# Patient Record
Sex: Male | Born: 1975 | Race: White | Hispanic: No | Marital: Married | Smoking: Former smoker
Health system: Southern US, Community
[De-identification: ages and names within clinical notes are randomized; demographics above are authoritative.]

## PROBLEM LIST (undated history)

## (undated) DIAGNOSIS — J329 Chronic sinusitis, unspecified: Secondary | ICD-10-CM

## (undated) DIAGNOSIS — IMO0001 Reserved for inherently not codable concepts without codable children: Secondary | ICD-10-CM

## (undated) DIAGNOSIS — E785 Hyperlipidemia, unspecified: Secondary | ICD-10-CM

## (undated) DIAGNOSIS — G43909 Migraine, unspecified, not intractable, without status migrainosus: Secondary | ICD-10-CM

## (undated) DIAGNOSIS — F329 Major depressive disorder, single episode, unspecified: Secondary | ICD-10-CM

## (undated) DIAGNOSIS — I10 Essential (primary) hypertension: Secondary | ICD-10-CM

## (undated) DIAGNOSIS — F32A Depression, unspecified: Secondary | ICD-10-CM

## (undated) DIAGNOSIS — F319 Bipolar disorder, unspecified: Secondary | ICD-10-CM

## (undated) HISTORY — PX: OTHER SURGICAL HISTORY: SHX169

---

## 2008-11-02 ENCOUNTER — Ambulatory Visit: Payer: Self-pay | Admitting: Family Medicine

## 2008-11-02 DIAGNOSIS — IMO0002 Reserved for concepts with insufficient information to code with codable children: Secondary | ICD-10-CM | POA: Insufficient documentation

## 2008-11-03 ENCOUNTER — Encounter: Payer: Self-pay | Admitting: Family Medicine

## 2012-04-21 ENCOUNTER — Emergency Department
Admission: EM | Admit: 2012-04-21 | Discharge: 2012-04-21 | Disposition: A | Discharge status: Home / Self Care | Payer: Self-pay | Source: Home / Self Care | Attending: Family Medicine | Admitting: Family Medicine

## 2012-04-21 ENCOUNTER — Encounter: Payer: Self-pay | Admitting: Emergency Medicine

## 2012-04-21 ENCOUNTER — Emergency Department (INDEPENDENT_AMBULATORY_CARE_PROVIDER_SITE_OTHER): Payer: 59

## 2012-04-21 DIAGNOSIS — R209 Unspecified disturbances of skin sensation: Secondary | ICD-10-CM

## 2012-04-21 DIAGNOSIS — S4352XA Sprain of left acromioclavicular joint, initial encounter: Secondary | ICD-10-CM

## 2012-04-21 DIAGNOSIS — S2341XA Sprain of ribs, initial encounter: Secondary | ICD-10-CM

## 2012-04-21 DIAGNOSIS — S29011A Strain of muscle and tendon of front wall of thorax, initial encounter: Secondary | ICD-10-CM

## 2012-04-21 DIAGNOSIS — W19XXXA Unspecified fall, initial encounter: Secondary | ICD-10-CM

## 2012-04-21 DIAGNOSIS — M25519 Pain in unspecified shoulder: Secondary | ICD-10-CM

## 2012-04-21 DIAGNOSIS — S4350XA Sprain of unspecified acromioclavicular joint, initial encounter: Secondary | ICD-10-CM

## 2012-04-21 MED ORDER — NAPROXEN 500 MG PO TABS: 500.0000 mg | ORAL_TABLET | Freq: Two times a day (BID) | ORAL | Status: DC

## 2012-04-21 MED ORDER — OXYCODONE-ACETAMINOPHEN 5-325 MG PO TABS: ORAL_TABLET | ORAL | Status: DC

## 2012-04-21 NOTE — ED Provider Notes (Signed)
History     CSN: [id number]  Arrival date & time 04/21/12  1108   First MD Initiated Contact with Patient 04/21/12 1239      Chief Complaint  Patient presents with  . Shoulder Injury     HPI Comments: While running yesterday, patient tripped and fell on his left shoulder and chest.  He has had persistent pain in his left shoulder and left anterior ribs.  No shortness of breath   Patient is a 37 y.o. male presenting with shoulder pain. The history is provided by the patient.  Shoulder Pain This is a new problem. The current episode started yesterday. The problem occurs constantly. Associated symptoms include chest pain. Pertinent negatives include no abdominal pain and no shortness of breath. Exacerbated by: raising left arm. Nothing relieves the symptoms. Treatments tried: Vicodin. The treatment provided mild relief.    History reviewed. No pertinent past medical history.  Past Surgical History  Procedure Date  . Right lower leg tendons     Family History  Problem Relation Age of Onset  . Cancer Mother   . Cancer Father     History  Substance Use Topics  . Smoking status: Current Every Day Smoker -- 0.5 packs/day for 25 years  . Smokeless tobacco: Not on file  . Alcohol Use: No      Review of Systems  Respiratory: Negative for shortness of breath.   Cardiovascular: Positive for chest pain.  Gastrointestinal: Negative for abdominal pain.  All other systems reviewed and are negative.    Allergies  Review of patient's allergies indicates no known allergies.  Home Medications   Current Outpatient Rx  Name  Route  Sig  Dispense  Refill  . NAPROXEN 500 MG PO TABS   Oral   Take 1 tablet (500 mg total) by mouth 2 (two) times daily. (every 12 hours with food)   20 tablet   1   . OXYCODONE-ACETAMINOPHEN 5-325 MG PO TABS      Take one tab by mouth HS PRN pain   7 tablet   0     BP 143/99  Pulse 107  Temp 98 F (36.7 C) (Oral)  Resp 16  Ht 5' 7.5"  (1.715 m)  Wt 207 lb (93.895 kg)  BMI 31.94 kg/m2  SpO2 99%  Physical Exam  Nursing note and vitals reviewed. Constitutional: He is oriented to person, place, and time. He appears well-developed and well-nourished. No distress.  HENT:  Head: Atraumatic.  Nose: Nose normal.  Mouth/Throat: Oropharynx is clear and moist.  Eyes: Conjunctivae normal and EOM are normal. Pupils are equal, round, and reactive to light.  Neck: Normal range of motion.  Cardiovascular: Normal heart sounds.   Pulmonary/Chest: Breath sounds normal.         There is rib tenderness under left pectoralis as noted on diagram.  Abdominal: There is no tenderness.  Musculoskeletal:       Left shoulder: He exhibits decreased range of motion, tenderness and bony tenderness. He exhibits no swelling, no effusion, no crepitus, no deformity, no laceration, normal pulse and normal strength.       Arms:      There is tenderness over left AC joint and left distal clavicle as noted on diagram.  Patient has difficulty abducting left arm above horizontal.   Neurological: He is alert and oriented to person, place, and time.  Skin: Skin is warm and dry. He is not diaphoretic.    ED Course  Procedures none  Dg Ribs Unilateral W/chest Left  04/21/2012  *RADIOLOGY REPORT*  Clinical Data: Fall, rib tenderness.  LEFT RIBS AND CHEST - 3+ VIEW  Comparison: None.  Findings: Heart and mediastinal contours are within normal limits. No focal opacities or effusions.  No acute bony abnormality.  No visible rib fracture.  No pneumothorax.  IMPRESSION: No active cardiopulmonary disease.   Original Report Authenticated By: Charlett Nose, M.D.    Dg Clavicle Left  04/21/2012  *RADIOLOGY REPORT*  Clinical Data: Fall, left clavicle tenderness.  LEFT CLAVICLE - 2+ VIEWS  Comparison: None.  Findings: No acute bony abnormality.  Specifically, no fracture, subluxation, or dislocation.  Soft tissues are intact.  Joint spaces are maintained.  IMPRESSION:  No bony abnormality.   Original Report Authenticated By: Charlett Nose, M.D.    Dg Ac Joints  04/21/2012  *RADIOLOGY REPORT*  Clinical Data: Fall, left shoulder pain.  LEFT ACROMIOCLAVICULAR JOINTS  Comparison: None.  Findings: Left AC joint is normal both without and with weights. No fracture.  No subluxation or dislocation.  The distal right clavicle is irregular.  There is widening of the right AC joint. This may be related to prior resection of the distal right clavicle.  No change with weightbearing.  IMPRESSION: No acute bony abnormality or evidence of AC joint separation on the left.  Mild widening of the right AC joint with irregularity in the distal right clavicle, question prior distal clavicular resection.   Original Report Authenticated By: Charlett Nose, M.D.      1. Sprain of left acromioclavicular joint   2. Intercostal muscle strain       MDM  Dispensed sling.  Begin Naproxen.  Rx for Percocet at bedtime prn Wear sling for 5 to 7 days.  Apply ice pack 3 or 4 times daily.  Begin range of motion exercises in about 5 days.   Followup with Dr. Rodney Langton if not improved in two weeks.        Lattie Haw, MD 04/25/12 2033

## 2012-04-21 NOTE — ED Notes (Signed)
Left shoulder injury, front and back upper ribs from fall yesterday

## 2014-07-10 ENCOUNTER — Emergency Department (HOSPITAL_COMMUNITY): Payer: BLUE CROSS/BLUE SHIELD

## 2014-07-10 ENCOUNTER — Emergency Department (HOSPITAL_COMMUNITY)
Admission: EM | Admit: 2014-07-10 | Discharge: 2014-07-11 | Disposition: A | Discharge status: Home / Self Care | Payer: BLUE CROSS/BLUE SHIELD | Attending: Emergency Medicine | Admitting: Emergency Medicine

## 2014-07-10 ENCOUNTER — Encounter (HOSPITAL_COMMUNITY): Payer: Self-pay | Admitting: *Deleted

## 2014-07-10 DIAGNOSIS — Z87891 Personal history of nicotine dependence: Secondary | ICD-10-CM | POA: Insufficient documentation

## 2014-07-10 DIAGNOSIS — Z79891 Long term (current) use of opiate analgesic: Secondary | ICD-10-CM | POA: Diagnosis not present

## 2014-07-10 DIAGNOSIS — R079 Chest pain, unspecified: Secondary | ICD-10-CM | POA: Diagnosis not present

## 2014-07-10 DIAGNOSIS — R61 Generalized hyperhidrosis: Secondary | ICD-10-CM | POA: Diagnosis not present

## 2014-07-10 LAB — TROPONIN I

## 2014-07-10 LAB — CBC WITH DIFFERENTIAL/PLATELET
Basophils Absolute: 0 10*3/uL (ref 0.0–0.1)
Basophils Relative: 0 % (ref 0–1)
EOS PCT: 5 % (ref 0–5)
Eosinophils Absolute: 0.3 10*3/uL (ref 0.0–0.7)
HCT: 39.8 % (ref 39.0–52.0)
Hemoglobin: 13.5 g/dL (ref 13.0–17.0)
LYMPHS ABS: 2.2 10*3/uL (ref 0.7–4.0)
Lymphocytes Relative: 43 % (ref 12–46)
MCH: 30.7 pg (ref 26.0–34.0)
MCHC: 33.9 g/dL (ref 30.0–36.0)
MCV: 90.5 fL (ref 78.0–100.0)
MONO ABS: 0.7 10*3/uL (ref 0.1–1.0)
Monocytes Relative: 14 % — ABNORMAL HIGH (ref 3–12)
Neutro Abs: 1.9 10*3/uL (ref 1.7–7.7)
Neutrophils Relative %: 38 % — ABNORMAL LOW (ref 43–77)
Platelets: 187 10*3/uL (ref 150–400)
RBC: 4.4 MIL/uL (ref 4.22–5.81)
RDW: 12.6 % (ref 11.5–15.5)
WBC: 5 10*3/uL (ref 4.0–10.5)

## 2014-07-10 LAB — COMPREHENSIVE METABOLIC PANEL
ALBUMIN: 4 g/dL (ref 3.5–5.2)
ALT: 27 U/L (ref 0–53)
ANION GAP: 9 (ref 5–15)
AST: 27 U/L (ref 0–37)
Alkaline Phosphatase: 71 U/L (ref 39–117)
BILIRUBIN TOTAL: 0.4 mg/dL (ref 0.3–1.2)
BUN: 15 mg/dL (ref 6–23)
CALCIUM: 8.8 mg/dL (ref 8.4–10.5)
CHLORIDE: 105 mmol/L (ref 96–112)
CO2: 26 mmol/L (ref 19–32)
Creatinine, Ser: 0.99 mg/dL (ref 0.50–1.35)
GFR calc Af Amer: >90 mL/min (ref >90)
GFR calc non Af Amer: >90 mL/min (ref >90)
GLUCOSE: 104 mg/dL — AB (ref 70–99)
POTASSIUM: 3.8 mmol/L (ref 3.5–5.1)
SODIUM: 140 mmol/L (ref 135–145)
TOTAL PROTEIN: 6.8 g/dL (ref 6.0–8.3)

## 2014-07-10 NOTE — ED Notes (Signed)
Pt reports chest pain while driving. Pain described as sharp, squeezing. EMS gave 324 ASA.

## 2014-07-10 NOTE — ED Provider Notes (Signed)
CSN: [id number]     Arrival date & time 07/10/14  2138 History   First MD Initiated Contact with Patient 07/10/14 2141     Chief Complaint  Patient presents with  . Chest Pain     (Consider location/radiation/quality/duration/timing/severity/associated sxs/prior Treatment) The history is provided by the patient and the spouse.   Marc Liu is a 39 y.o. male  Presenting with midsternal chest pain described as Liu and squeezing and started about 1 hour prior to arrival.  He and his wife was on a casual drive, non stressful, when he developed symptoms.  The pain was intense and was associated with diaphoresis and pain that radiated into his upper chest.  He denies having dizziness, sob, nausea or vomiting.  His symptoms have now improved and he received aspirin 325 mg prior to arrival per ems.  He has a history of occasional acid reflux, but states this pain was not similar to reflux.  He last ate a chicken sandwich around 4:30 pm.  Recently quit smoking 2 months ago.  No h/o of htn or cholesterol concerns.  Father diseased from MI at age 41.    History reviewed. No pertinent past medical history. Past Surgical History  Procedure Laterality Date  . Right lower leg tendons     Family History  Problem Relation Age of Onset  . Cancer Mother   . Cancer Father    History  Substance Use Topics  . Smoking status: Former Smoker -- 0.50 packs/day for 25 years    Quit date: 05/07/2014  . Smokeless tobacco: Not on file  . Alcohol Use: Yes     Comment: occ    Review of Systems  Constitutional: Positive for diaphoresis. Negative for fever.  HENT: Negative for congestion and sore throat.   Eyes: Negative.   Respiratory: Negative for chest tightness and shortness of breath.   Cardiovascular: Positive for chest pain. Negative for palpitations.  Gastrointestinal: Negative for nausea, vomiting and abdominal pain.  Genitourinary: Negative.   Musculoskeletal: Negative for joint  swelling, arthralgias and neck pain.  Skin: Negative.  Negative for rash and wound.  Neurological: Negative for dizziness, weakness, light-headedness, numbness and headaches.  Psychiatric/Behavioral: Negative.       Allergies  Review of patient's allergies indicates no known allergies.  Home Medications   Prior to Admission medications   Medication Sig Start Date End Date Taking? Authorizing Provider  carbamazepine (TEGRETOL) 200 MG tablet Take 200 mg by mouth 3 (three) times daily. 05/20/14 05/20/15 Yes Historical Provider, MD  clotrimazole (LOTRIMIN) 1 % cream Apply 1 application topically daily as needed.   Yes Historical Provider, MD  fluticasone (FLONASE) 50 MCG/ACT nasal spray Place 2 sprays into the nose daily.   Yes Historical Provider, MD  mometasone (ELOCON) 0.1 % cream Apply 1 application topically daily as needed. 05/11/14  Yes Historical Provider, MD  rizatriptan (MAXALT) 10 MG tablet Take 1 tablet by mouth as needed. For migraine pain 05/20/14  Yes Historical Provider, MD  naproxen (NAPROSYN) 500 MG tablet Take 1 tablet (500 mg total) by mouth 2 (two) times daily. (every 12 hours with food) Patient not taking: Reported on 07/10/2014 04/21/12   Lattie HawStephen A Beese, MD  oxyCODONE-acetaminophen (PERCOCET/ROXICET) 5-325 MG per tablet Take one tab by mouth HS PRN pain Patient not taking: Reported on 07/10/2014 04/21/12   Lattie HawStephen A Beese, MD   BP 128/92 mmHg  Pulse 73  Temp(Src) 98.2 F (36.8 C) (Oral)  Resp 17  Ht 5\' 7"  (  1.702 m)  Wt 205 lb (92.987 kg)  BMI 32.10 kg/m2  SpO2 96% Physical Exam  Constitutional: He appears well-developed and well-nourished.  HENT:  Head: Normocephalic and atraumatic.  Eyes: Conjunctivae are normal.  Neck: Normal range of motion.  Cardiovascular: Normal rate, regular rhythm, normal heart sounds and intact distal pulses.   Pulmonary/Chest: Effort normal and breath sounds normal. He has no wheezes. He exhibits tenderness.  Endorses slight ttp mid  sternum   Abdominal: Soft. Bowel sounds are normal. There is no tenderness.  Musculoskeletal: Normal range of motion.  Neurological: He is alert.  Skin: Skin is warm and dry.  Psychiatric: He has a normal mood and affect.  Nursing note and vitals reviewed.   ED Course  Procedures (including critical care time) Labs Review Labs Reviewed  CBC WITH DIFFERENTIAL/PLATELET - Abnormal; Notable for the following:    Neutrophils Relative % 38 (*)    Monocytes Relative 14 (*)    All other components within normal limits  COMPREHENSIVE METABOLIC PANEL - Abnormal; Notable for the following:    Glucose, Bld 104 (*)    All other components within normal limits  TROPONIN I  TROPONIN I    Imaging Review Dg Chest Portable 1 View  07/10/2014   CLINICAL DATA:  Patient with chest pain.  EXAM: PORTABLE CHEST - 1 VIEW  COMPARISON:  Chest radiograph 04/21/2012  FINDINGS: Monitoring leads overlie the patient. Stable cardiac and mediastinal contours. No consolidative pulmonary opacities. No pleural effusion or pneumothorax. Regional skeleton is unremarkable.  IMPRESSION: No acute cardiopulmonary process.   Electronically Signed   By: Annia Belt M.D.   On: 07/10/2014 22:13     EKG Interpretation   Date/Time:  Saturday July 10 2014 21:40:37 EDT Ventricular Rate:  79 PR Interval:  176 QRS Duration: 90 QT Interval:  362 QTC Calculation: 415 R Axis:   89 Text Interpretation:  Sinus rhythm Confirmed by Fayrene Fearing  MD, MARK ([id number]) on  07/10/2014 11:27:12 PM      MDM   Final diagnoses:  Chest pain, unspecified chest pain type    Patients labs and/or radiological studies were reviewed and considered during the medical decision making and disposition process.  Results were also discussed with patient. Pending second troponin, to be drawn at 2 am.  Pt with low risk, HEART score of 0.  Atypical presentation.  Plan to dispo home if second troponin is negative.    Discussed with Dr. Bebe Shaggy who will  dispo after 2nd marker results.    Burgess Amor, PA-C 07/11/14 0014  Rolland Porter, MD 07/15/14 [phone]

## 2014-07-11 LAB — TROPONIN I: Troponin I: <0.03 ng/mL (ref <0.031)

## 2014-07-11 NOTE — ED Provider Notes (Signed)
Pt improved Now he reports his sternum is sore, but no other complaints Awaiting repeat troponin, if negative will d/c home   Zadie Rhineonald Demerius Podolak, MD 07/11/14 0147

## 2014-07-11 NOTE — Discharge Instructions (Signed)

## 2015-02-13 ENCOUNTER — Emergency Department
Admission: EM | Admit: 2015-02-13 | Discharge: 2015-02-13 | Payer: BLUE CROSS/BLUE SHIELD | Source: Home / Self Care | Attending: Emergency Medicine | Admitting: Emergency Medicine

## 2015-02-13 ENCOUNTER — Encounter (HOSPITAL_COMMUNITY): Payer: Self-pay | Admitting: Emergency Medicine

## 2015-02-13 ENCOUNTER — Inpatient Hospital Stay (HOSPITAL_COMMUNITY)
Admission: EM | Admit: 2015-02-13 | Discharge: 2015-02-16 | DRG: 193 | Disposition: A | Discharge status: Home / Self Care | Payer: BLUE CROSS/BLUE SHIELD | Attending: Internal Medicine | Admitting: Internal Medicine

## 2015-02-13 ENCOUNTER — Encounter: Payer: Self-pay | Admitting: *Deleted

## 2015-02-13 ENCOUNTER — Emergency Department (HOSPITAL_COMMUNITY): Payer: BLUE CROSS/BLUE SHIELD

## 2015-02-13 DIAGNOSIS — J9601 Acute respiratory failure with hypoxia: Secondary | ICD-10-CM | POA: Diagnosis present

## 2015-02-13 DIAGNOSIS — J4541 Moderate persistent asthma with (acute) exacerbation: Secondary | ICD-10-CM

## 2015-02-13 DIAGNOSIS — R011 Cardiac murmur, unspecified: Secondary | ICD-10-CM | POA: Diagnosis present

## 2015-02-13 DIAGNOSIS — J129 Viral pneumonia, unspecified: Secondary | ICD-10-CM | POA: Diagnosis not present

## 2015-02-13 DIAGNOSIS — Z809 Family history of malignant neoplasm, unspecified: Secondary | ICD-10-CM

## 2015-02-13 DIAGNOSIS — G43909 Migraine, unspecified, not intractable, without status migrainosus: Secondary | ICD-10-CM | POA: Diagnosis present

## 2015-02-13 DIAGNOSIS — Z87891 Personal history of nicotine dependence: Secondary | ICD-10-CM

## 2015-02-13 HISTORY — DX: Depression, unspecified: F32.A

## 2015-02-13 HISTORY — DX: Bipolar disorder, unspecified: F31.9

## 2015-02-13 HISTORY — DX: Reserved for inherently not codable concepts without codable children: IMO0001

## 2015-02-13 HISTORY — DX: Major depressive disorder, single episode, unspecified: F32.9

## 2015-02-13 HISTORY — DX: Chronic sinusitis, unspecified: J32.9

## 2015-02-13 HISTORY — DX: Migraine, unspecified, not intractable, without status migrainosus: G43.909

## 2015-02-13 LAB — BASIC METABOLIC PANEL
ANION GAP: 8 (ref 5–15)
BUN: 15 mg/dL (ref 6–20)
CO2: 24 mmol/L (ref 22–32)
Calcium: 9.9 mg/dL (ref 8.9–10.3)
Chloride: 107 mmol/L (ref 101–111)
Creatinine, Ser: 0.78 mg/dL (ref 0.61–1.24)
GLUCOSE: 161 mg/dL — AB (ref 65–99)
Potassium: 4 mmol/L (ref 3.5–5.1)
SODIUM: 139 mmol/L (ref 135–145)

## 2015-02-13 LAB — CBC WITH DIFFERENTIAL/PLATELET
Basophils Absolute: 0 10*3/uL (ref 0.0–0.1)
Basophils Relative: 0 %
EOS PCT: 0 %
Eosinophils Absolute: 0 10*3/uL (ref 0.0–0.7)
HCT: 42.6 % (ref 39.0–52.0)
Hemoglobin: 13.9 g/dL (ref 13.0–17.0)
LYMPHS ABS: 0.6 10*3/uL — AB (ref 0.7–4.0)
LYMPHS PCT: 15 %
MCH: 30.3 pg (ref 26.0–34.0)
MCHC: 32.6 g/dL (ref 30.0–36.0)
MCV: 93 fL (ref 78.0–100.0)
MONO ABS: 0.2 10*3/uL (ref 0.1–1.0)
MONOS PCT: 5 %
Neutro Abs: 3.2 10*3/uL (ref 1.7–7.7)
Neutrophils Relative %: 80 %
PLATELETS: 237 10*3/uL (ref 150–400)
RBC: 4.58 MIL/uL (ref 4.22–5.81)
RDW: 12.7 % (ref 11.5–15.5)
WBC: 4 10*3/uL (ref 4.0–10.5)

## 2015-02-13 LAB — I-STAT TROPONIN, ED: Troponin i, poc: 0.01 ng/mL (ref 0.00–0.08)

## 2015-02-13 MED ORDER — GI COCKTAIL ~~LOC~~
30.0000 mL | Freq: Once | ORAL | Status: AC
  Administered 2015-02-13: 30 mL via ORAL
  Filled 2015-02-13: qty 30

## 2015-02-13 MED ORDER — ALBUTEROL SULFATE (2.5 MG/3ML) 0.083% IN NEBU
5.0000 mg | INHALATION_SOLUTION | Freq: Once | RESPIRATORY_TRACT | Status: DC
Start: 2015-02-13 — End: 2015-02-13

## 2015-02-13 MED ORDER — ALBUTEROL SULFATE (2.5 MG/3ML) 0.083% IN NEBU
5.0000 mg | INHALATION_SOLUTION | Freq: Once | RESPIRATORY_TRACT | Status: AC
  Administered 2015-02-13: 5 mg via RESPIRATORY_TRACT
  Filled 2015-02-13: qty 6

## 2015-02-13 MED ORDER — ACETAMINOPHEN 650 MG RE SUPP: 650.0000 mg | Freq: Four times a day (QID) | RECTAL | Status: DC | PRN

## 2015-02-13 MED ORDER — ENOXAPARIN SODIUM 40 MG/0.4ML ~~LOC~~ SOLN
40.0000 mg | Freq: Every day | SUBCUTANEOUS | Status: DC
  Administered 2015-02-14 – 2015-02-15 (×3): 40 mg via SUBCUTANEOUS
  Filled 2015-02-13 (×4): qty 0.4

## 2015-02-13 MED ORDER — HYDROMORPHONE HCL 1 MG/ML IJ SOLN: 0.5000 mg | INTRAMUSCULAR | Status: DC | PRN

## 2015-02-13 MED ORDER — CARBAMAZEPINE 200 MG PO TABS
200.0000 mg | ORAL_TABLET | Freq: Three times a day (TID) | ORAL | Status: DC
  Administered 2015-02-14 – 2015-02-16 (×8): 200 mg via ORAL
  Filled 2015-02-13 (×10): qty 1

## 2015-02-13 MED ORDER — ALBUTEROL SULFATE (2.5 MG/3ML) 0.083% IN NEBU
2.5000 mg | INHALATION_SOLUTION | RESPIRATORY_TRACT | Status: DC | PRN
  Administered 2015-02-13: 2.5 mg via RESPIRATORY_TRACT
  Filled 2015-02-13: qty 3

## 2015-02-13 MED ORDER — OSELTAMIVIR PHOSPHATE 75 MG PO CAPS
75.0000 mg | ORAL_CAPSULE | Freq: Two times a day (BID) | ORAL | Status: DC
  Administered 2015-02-13 – 2015-02-16 (×6): 75 mg via ORAL
  Filled 2015-02-13 (×7): qty 1

## 2015-02-13 MED ORDER — ONDANSETRON HCL 4 MG PO TABS: 4.0000 mg | ORAL_TABLET | Freq: Four times a day (QID) | ORAL | Status: DC | PRN

## 2015-02-13 MED ORDER — ALBUTEROL SULFATE (2.5 MG/3ML) 0.083% IN NEBU
2.5000 mg | INHALATION_SOLUTION | Freq: Four times a day (QID) | RESPIRATORY_TRACT | Status: DC
  Administered 2015-02-13 – 2015-02-14 (×2): 2.5 mg via RESPIRATORY_TRACT
  Filled 2015-02-13: qty 3

## 2015-02-13 MED ORDER — SODIUM CHLORIDE 0.9 % IV SOLN
INTRAVENOUS | Status: DC
  Administered 2015-02-14 – 2015-02-15 (×2): via INTRAVENOUS

## 2015-02-13 MED ORDER — ALUM & MAG HYDROXIDE-SIMETH 200-200-20 MG/5ML PO SUSP
30.0000 mL | Freq: Four times a day (QID) | ORAL | Status: DC | PRN
  Administered 2015-02-14: 30 mL via ORAL
  Filled 2015-02-13: qty 30

## 2015-02-13 MED ORDER — ACETAMINOPHEN 325 MG PO TABS: 650.0000 mg | ORAL_TABLET | Freq: Four times a day (QID) | ORAL | Status: DC | PRN

## 2015-02-13 MED ORDER — HYDROCOD POLST-CPM POLST ER 10-8 MG/5ML PO SUER
5.0000 mL | Freq: Two times a day (BID) | ORAL | Status: DC | PRN
  Administered 2015-02-14: 5 mL via ORAL
  Filled 2015-02-13: qty 5

## 2015-02-13 MED ORDER — ONDANSETRON HCL 4 MG/2ML IJ SOLN: 4.0000 mg | Freq: Four times a day (QID) | INTRAMUSCULAR | Status: DC | PRN

## 2015-02-13 MED ORDER — OXYCODONE HCL 5 MG PO TABS
5.0000 mg | ORAL_TABLET | ORAL | Status: DC | PRN
  Administered 2015-02-14 – 2015-02-15 (×4): 5 mg via ORAL
  Filled 2015-02-13 (×4): qty 1

## 2015-02-13 MED ORDER — METHYLPREDNISOLONE SODIUM SUCC 125 MG IJ SOLR
125.0000 mg | Freq: Once | INTRAMUSCULAR | Status: AC
  Administered 2015-02-13: 125 mg via INTRAVENOUS
  Filled 2015-02-13: qty 2

## 2015-02-13 MED ORDER — ALBUTEROL SULFATE (2.5 MG/3ML) 0.083% IN NEBU: 5.0000 mg | INHALATION_SOLUTION | Freq: Once | RESPIRATORY_TRACT | Status: DC

## 2015-02-13 NOTE — ED Provider Notes (Signed)
CSN: [id number]     Arrival date & time 02/13/15  1819 History   First MD Initiated Contact with Patient 02/13/15 1926     Chief Complaint  Patient presents with  . Shortness of Breath  . Generalized Body Aches      Patient is a 39 y.o. male presenting with shortness of breath. The history is provided by the patient. No language interpreter was used.  Shortness of Breath  Marc Liu is a 39 y.o. male who presents to the Emergency Department complaining of shortness of breath. He reports 1 week of body aches, cough, shortness of breath. He's had fevers to 103 at home, last fever 2 days ago. He was seen by his primary care doctor 5 days ago and had a negative flu test and some blood work performed. He was her on a Z-Pak for a bacterial infection. He was not improving and was seen 2 days ago and started on Levaquin. He was worsening that same day and was seen again since her on a steroid and a breathing treatment. Today he presented to urgent care for further evaluation for worsening shortness of breath and cough. He was given 2 nebulizer treatments there with borderline hypoxia and no significant improvement in his symptoms and he was referred to the emergency department for further evaluation. He denies any similar previous symptoms, no history of cardiac disease, no lower extremity edema. Symptoms are severe, constant, worsening. He took Levaquin and his prednisone today.   Past Medical History  Diagnosis Date  . Sinusitis   . Depression   . Migraines    Past Surgical History  Procedure Laterality Date  . Right lower leg tendons     Family History  Problem Relation Age of Onset  . Cancer Mother   . Cancer Father    Social History  Substance Use Topics  . Smoking status: Former Smoker -- 0.50 packs/day for 25 years    Quit date: 05/07/2014  . Smokeless tobacco: Never Used  . Alcohol Use: Yes     Comment: occ    Review of Systems  Respiratory: Positive for  shortness of breath.   All other systems reviewed and are negative.     Allergies  Review of patient's allergies indicates no known allergies.  Home Medications   Prior to Admission medications   Medication Sig Start Date End Date Taking? Authorizing Provider  albuterol (PROVENTIL) (2.5 MG/3ML) 0.083% nebulizer solution Take 2.5 mg by nebulization every 4 (four) hours as needed for wheezing or shortness of breath.  02/11/15 02/11/16 Yes Historical Provider, MD  carbamazepine (TEGRETOL) 200 MG tablet Take 200 mg by mouth 3 (three) times daily. 05/20/14 05/20/15 Yes Historical Provider, MD  levofloxacin (LEVAQUIN) 500 MG tablet Take 500 mg by mouth daily.  02/11/15 02/21/15 Yes Historical Provider, MD  predniSONE (DELTASONE) 20 MG tablet Take 60 mg by mouth daily with breakfast.  02/11/15 02/16/15 Yes Historical Provider, MD  rizatriptan (MAXALT) 10 MG tablet Take 1 tablet by mouth as needed. For migraine pain 05/20/14  Yes Historical Provider, MD  HYDROcodone-homatropine (HYCODAN) 5-1.5 MG/5ML syrup Take 5 mLs by mouth every 4 (four) hours as needed for cough.  02/08/15 02/18/15  Historical Provider, MD  naproxen (NAPROSYN) 500 MG tablet Take 1 tablet (500 mg total) by mouth 2 (two) times daily. (every 12 hours with food) Patient not taking: Reported on 07/10/2014 04/21/12   Lattie Haw, MD  oxyCODONE-acetaminophen (PERCOCET/ROXICET) 5-325 MG per tablet Take one tab by  mouth HS PRN pain Patient not taking: Reported on 07/10/2014 04/21/12   Lattie HawStephen A Beese, MD   BP 117/78 mmHg  Pulse 93  Temp(Src) 98.3 F (36.8 C) (Oral)  Resp 22  SpO2 92% Physical Exam  Constitutional: He is oriented to person, place, and time. He appears well-developed and well-nourished.  HENT:  Head: Normocephalic and atraumatic.  Cardiovascular: Normal rate and regular rhythm.   No murmur heard. Pulmonary/Chest: Effort normal. No respiratory distress.  Wheezes and rhonchi bilaterally  Abdominal: Soft. There is  no tenderness. There is no rebound and no guarding.  Musculoskeletal: He exhibits no edema or tenderness.  Neurological: He is alert and oriented to person, place, and time.  Skin: Skin is warm and dry.  Psychiatric: He has a normal mood and affect. His behavior is normal.  Nursing note and vitals reviewed.   ED Course  Procedures (including critical care time) Labs Review Labs Reviewed  BASIC METABOLIC PANEL - Abnormal; Notable for the following:    Glucose, Bld 161 (*)    All other components within normal limits  CBC WITH DIFFERENTIAL/PLATELET - Abnormal; Notable for the following:    Lymphs Abs 0.6 (*)    All other components within normal limits  I-STAT TROPOININ, ED    Imaging Review Dg Chest 2 View  02/13/2015  CLINICAL DATA:  Shortness of breath and nonproductive cough. Patient received breathing treatment. Fevers and night sweats for 6 days. EXAM: CHEST  2 VIEW COMPARISON:  07/10/2014. FINDINGS: Low lung volumes. Normal cardiomediastinal silhouette. Increased perihilar markings suggesting viral pneumonitis. No lobar consolidation. No effusion or pneumothorax. Worsening aeration from priors. No osseous findings. IMPRESSION: Increased perihilar markings suggesting viral pneumonitis. No lobar consolidation. Worsening aeration from priors. Electronically Signed   By: Elsie StainJohn T Curnes M.D.   On: 02/13/2015 20:44   I have personally reviewed and evaluated these images and lab results as part of my medical decision-making.   EKG Interpretation None      MDM   Final diagnoses:  Viral pneumonia     Patient with no significant past medical history here for progressive cough and shortness of breath. His significant wheezing or rhonchi on examination, no significant change exam following albuterol treatment, he's had multiple albuterol treatments prior to ED arrival with no significant change as well. Chest x-ray consistent with viral pneumonia assess his clinical presentation. He  does have hypoxia with ambulation and significant shortness of breath and diaphoresis with activity. Plan to admit for supportive care with supplemental oxygen as needed.    Tilden FossaElizabeth Omario Ander, MD 02/13/15 2241

## 2015-02-13 NOTE — ED Notes (Signed)
Pt complains of cough, extreme fatigue, wheezing, body aches for 6 days.  He was given a zpak that he finished last wk and then seen in the ER 2 days ago and given Levaquin and Prednisone as well as breathing treatments.  He is still having SOB and a cough.  He says it hurts to take a deep breath.

## 2015-02-13 NOTE — ED Notes (Signed)
Called Claris CheMargaret to give report she ask me to return call in "like" 5 minutes,

## 2015-02-13 NOTE — Discharge Instructions (Signed)
Go to the ED now 

## 2015-02-13 NOTE — ED Notes (Signed)
Charge nurse to return call for report

## 2015-02-13 NOTE — ED Notes (Signed)
Pt's room switched after having bed for over an hour, then I attempted report to new assignment,  Nurse unavailable to take report , ask if I could call back later [facility] at [phone]

## 2015-02-13 NOTE — H&P (Addendum)
[facility] Admission History and Physical       KHAIRI GARMAN ZHY:[id number] DOB: 05/11/1975 DOA: 02/13/2015  Referring physician: EDP PCP: Verl Bangs, MD  Specialists:   Chief Complaint: Fever Chills Cough and SOB  HPI: Marc Liu is a 39 y.o. male with a history of Migraines who presents to the ED with complaints of fever chills night sweats and myalgias along with Non Productive Cough with Chest Congestion and worsening SOB x 6 days.  He had been seen in an [facility] and placed on Levaquin and Prednisone and Hycodan Cough syrup  2 days ago, but has continued to worsen.   He was seen in the [facility] again today and sent to the ED.    He was found to have hypoxia to 89% at the [facility] and was placed on 3 liters [place] O2.   In the Ed he was found to have Viral Pneumonia on Chest X-ray and he was administered IV Solumedrol and Albuterol Nebs and referred for admission.      Review of Systems:  Constitutional: No Weight Loss, No Weight Gain, +Night Sweats, +Fevers, +Chills, +Myalgias, Dizziness, Light Headedness, Fatigue, or Generalized Weakness HEENT: No Headaches, Difficulty Swallowing,Tooth/Dental Problems,Sore Throat,  No Sneezing, Rhinitis, Ear Ache, Nasal Congestion, or Post Nasal Drip,  Cardio-vascular:  No Chest pain, Orthopnea, PND, Edema in Lower Extremities, Anasarca, Dizziness, Palpitations  Resp: No Dyspnea, No DOE, +Non-Productive Cough, No Hemoptysis, No Wheezing.    GI: No Heartburn, Indigestion, Abdominal Pain, Nausea, Vomiting, Diarrhea, Constipation, Hematemesis, Hematochezia, Melena, Change in Bowel Habits,  Loss of Appetite  GU: No Dysuria, No Change in Color of Urine, No Urgency or Urinary Frequency, No Flank pain.  Musculoskeletal: No Joint Pain or Swelling, No Decreased Range of Motion, No Back Pain.  Neurologic: No Syncope, No Seizures, Muscle Weakness, Paresthesia, Vision Disturbance or Loss, No Diplopia, No Vertigo, No Difficulty Walking,    Skin: No Rash or Lesions. Psych: No Change in Mood or Affect, No Depression or Anxiety, No Memory loss, No Confusion, or Hallucinations   Past Medical History  Diagnosis Date  . Sinusitis   . Depression   . Migraines      Past Surgical History  Procedure Laterality Date  . Right lower leg tendons        Prior to Admission medications   Medication Sig Start Date End Date Taking? Authorizing Provider  albuterol (PROVENTIL) (2.5 MG/3ML) 0.083% nebulizer solution Take 2.5 mg by nebulization every 4 (four) hours as needed for wheezing or shortness of breath.  02/11/15 02/11/16 Yes Historical Provider, MD  carbamazepine (TEGRETOL) 200 MG tablet Take 200 mg by mouth 3 (three) times daily. 05/20/14 05/20/15 Yes Historical Provider, MD  levofloxacin (LEVAQUIN) 500 MG tablet Take 500 mg by mouth daily.  02/11/15 02/21/15 Yes Historical Provider, MD  predniSONE (DELTASONE) 20 MG tablet Take 60 mg by mouth daily with breakfast.  02/11/15 02/16/15 Yes Historical Provider, MD  rizatriptan (MAXALT) 10 MG tablet Take 1 tablet by mouth as needed. For migraine pain 05/20/14  Yes Historical Provider, MD  HYDROcodone-homatropine (HYCODAN) 5-1.5 MG/5ML syrup Take 5 mLs by mouth every 4 (four) hours as needed for cough.  02/08/15 02/18/15  Historical Provider, MD  naproxen (NAPROSYN) 500 MG tablet Take 1 tablet (500 mg total) by mouth 2 (two) times daily. (every 12 hours with food) Patient not taking: Reported on 07/10/2014 04/21/12   Lattie Haw, MD  oxyCODONE-acetaminophen (PERCOCET/ROXICET) 5-325 MG per tablet Take one tab by mouth HS  PRN pain Patient not taking: Reported on 07/10/2014 04/21/12   Lattie Haw, MD     No Known Allergies     Social History:  reports that he quit smoking about 9 months ago. He has never used smokeless tobacco. He reports that he drinks alcohol. He reports that he does not use illicit drugs.    Family History  Problem Relation Age of Onset  . Cancer Mother   .  Cancer Father        Physical Exam:  GEN:  Pleasant Well Nourished and Well Developed 39 y.o. male examined and in no acute distress; cooperative with exam Filed Vitals:   02/13/15 2001 02/13/15 2128 02/13/15 2130 02/13/15 2200  BP: 130/82 136/96 136/84 129/85  Pulse: 76 99 99 100  Temp:      TempSrc:      Resp: SpO2: 95% 94% 92% 93%   Blood pressure 129/85, pulse 100, temperature 98.3 F (36.8 C), temperature source Oral, resp. rate 19, SpO2 93 %. PSYCH: He is alert and oriented x4; does not appear anxious does not appear depressed; affect is normal HEENT: Normocephalic and Atraumatic, Mucous membranes pink; PERRLA; EOM intact; Fundi:  Benign;  No scleral icterus, Nares: Patent, Oropharynx: Clear,  Fair Dentition,    Neck:  FROM, No Cervical Lymphadenopathy nor Thyromegaly or Carotid Bruit; No JVD; Breasts:: Not examined CHEST WALL: No tenderness CHEST: Normal respiration, clear to auscultation bilaterally HEART: Regular rate and rhythm; + III/ VI SEM at LSB,   No rubs or gallops BACK: No kyphosis or scoliosis; No CVA tenderness ABDOMEN: Positive Bowel Sounds, Soft Non-Tender, No Rebound or Guarding; No Masses, No Organomegaly. Rectal Exam: Not done EXTREMITIES: No Cyanosis, Clubbing, or Edema; No Ulcerations. Genitalia: not examined PULSES: 2+ and symmetric SKIN: Normal hydration no rash or ulceration CNS:  Alert and Oriented x 4, No Focal Deficits Vascular: pulses palpable throughout    Labs on Admission:  Basic Metabolic Panel:  Recent Labs Lab 02/13/15 1953  NA 139  K 4.0  CL 107  CO2 24  GLUCOSE 161*  BUN 15  CREATININE 0.78  CALCIUM 9.9   Liver Function Tests: No results for input(s): AST, ALT, ALKPHOS, BILITOT, PROT, ALBUMIN in the last 168 hours. No results for input(s): LIPASE, AMYLASE in the last 168 hours. No results for input(s): AMMONIA in the last 168 hours. CBC:  Recent Labs Lab 02/13/15 1953  WBC 4.0  NEUTROABS 3.2  HGB 13.9    HCT 42.6  MCV 93.0  PLT 237   Cardiac Enzymes: No results for input(s): CKTOTAL, CKMB, CKMBINDEX, TROPONINI in the last 168 hours.  BNP (last 3 results) No results for input(s): BNP in the last 8760 hours.  ProBNP (last 3 results) No results for input(s): PROBNP in the last 8760 hours.  CBG: No results for input(s): GLUCAP in the last 168 hours.  Radiological Exams on Admission: Dg Chest 2 View  02/13/2015  CLINICAL DATA:  Shortness of breath and nonproductive cough. Patient received breathing treatment. Fevers and night sweats for 6 days. EXAM: CHEST  2 VIEW COMPARISON:  07/10/2014. FINDINGS: Low lung volumes. Normal cardiomediastinal silhouette. Increased perihilar markings suggesting viral pneumonitis. No lobar consolidation. No effusion or pneumothorax. Worsening aeration from priors. No osseous findings. IMPRESSION: Increased perihilar markings suggesting viral pneumonitis. No lobar consolidation. Worsening aeration from priors. Electronically Signed   By: Elsie Stain M.D.   On: 02/13/2015 20:44     EKG: Independently reviewed.  Assessment/Plan:   39 y.o. male with  Principal Problem:   1.        Viral pneumonia   Albuterol Nebs   IV Solumedrol   Tussionex BID PRN   Tamiflu BID x 5 days     2.        Hypoxia-       O2 PRN   Monitor O2 sats       3.         Systolic Murmur   2D ECHO in AM     4.         DVT Prophylaxis   Lovenox     Code Status:     FULL CODE       Family Communication:    No Family Present    Disposition Plan:   Observation Status        Time spent:  5570 Minutes      Marc Liu,Camaron Cammack C [facility] Pager [phone]   If 7AM -7PM Please Contact the Day Rounding Team MD for [facility]  If 7PM-7AM, Please Contact Night-Floor Coverage  [email or web address] Password [email or web address] 02/13/2015, 10:28 PM     ADDENDUM:   Patient was seen and examined on 02/13/2015

## 2015-02-13 NOTE — ED Notes (Signed)
Pt c/o SOB. States [facility] was [facility] after receiving three breathing treatments and for possible admission. Pt with non productive cough. Has been having fevers and night sweats x 6 days ago, c/o generalized body aches.

## 2015-02-13 NOTE — ED Provider Notes (Signed)
CSN: [id number]     Arrival date & time 02/13/15  1656 History   First MD Initiated Contact with Patient 02/13/15 1721     Chief Complaint  Patient presents with  . Shortness of Breath  . Cough   (Consider location/radiation/quality/duration/timing/severity/associated sxs/prior Treatment) Patient is a 39 y.o. male presenting with shortness of breath and cough. The history is provided by the patient and the spouse. No language interpreter was used.  Shortness of Breath Severity:  Moderate Onset quality:  Gradual Duration:  8 days Timing:  Constant Progression:  Worsening Chronicity:  New Context: activity, smoke exposure and URI   Relieved by:  Nothing Worsened by:  Deep breathing, activity and coughing Ineffective treatments: antibiotics, albuterol and prednisone. Associated symptoms: cough and wheezing   Risk factors: no tobacco use   Cough Associated symptoms: shortness of breath and wheezing   Pt reports he has been sick for over a week.  Pt took a z pack and has been taking prednisone.   Pt was [facility] 2 days ago.  Pt was given albuterol nebs and steroid injection.  He has had 2 days of Levaquin.  Pt reports no relief with home nebulizations.  Pt reports feeling worse.  [facility],  History reviewed. No pertinent past medical history. Past Surgical History  Procedure Laterality Date  . Right lower leg tendons     Family History  Problem Relation Age of Onset  . Cancer Mother   . Cancer Father    Social History  Substance Use Topics  . Smoking status: Former Smoker -- 0.50 packs/day for 25 years    Quit date: 05/07/2014  . Smokeless tobacco: Never Used  . Alcohol Use: Yes     Comment: occ    Review of Systems  Respiratory: Positive for cough, shortness of breath and wheezing.   All other systems reviewed and are negative.   Allergies  Review of patient's allergies indicates no known allergies.  Home  Medications   Prior to Admission medications   Medication Sig Start Date End Date Taking? Authorizing Provider  albuterol (ACCUNEB) 1.25 MG/3ML nebulizer solution Take 1 ampule by nebulization every 6 (six) hours as needed for wheezing.   Yes Historical Provider, MD  levofloxacin (LEVAQUIN) 25 MG/ML solution Take by mouth daily.   Yes Historical Provider, MD  predniSONE (DELTASONE) 10 MG tablet Take 10 mg by mouth daily with breakfast.   Yes Historical Provider, MD  carbamazepine (TEGRETOL) 200 MG tablet Take 200 mg by mouth 3 (three) times daily. 05/20/14 05/20/15  Historical Provider, MD  clotrimazole (LOTRIMIN) 1 % cream Apply 1 application topically daily as needed.    Historical Provider, MD  fluticasone (FLONASE) 50 MCG/ACT nasal spray Place 2 sprays into the nose daily.    Historical Provider, MD  mometasone (ELOCON) 0.1 % cream Apply 1 application topically daily as needed. 05/11/14   Historical Provider, MD  naproxen (NAPROSYN) 500 MG tablet Take 1 tablet (500 mg total) by mouth 2 (two) times daily. (every 12 hours with food) Patient not taking: Reported on 07/10/2014 04/21/12   Lattie HawStephen A Beese, MD  oxyCODONE-acetaminophen (PERCOCET/ROXICET) 5-325 MG per tablet Take one tab by mouth HS PRN pain Patient not taking: Reported on 07/10/2014 04/21/12   Lattie HawStephen A Beese, MD  rizatriptan (MAXALT) 10 MG tablet Take 1 tablet by mouth as needed. For migraine pain 05/20/14   Historical Provider, MD   Meds Ordered  and Administered this Visit   Medications  albuterol (PROVENTIL) (2.5 MG/3ML) 0.083% nebulizer solution 5 mg (not administered)    BP 131/81 mmHg  Pulse 87  Temp(Src) 98.5 F (36.9 C) (Oral)  SpO2 93% No data found.   Physical Exam  Constitutional: He is oriented to person, place, and time. He appears well-developed and well-nourished.  HENT:  Head: Normocephalic and atraumatic.  Mouth/Throat: Oropharynx is clear and moist.  Eyes: Conjunctivae and EOM are normal. Pupils are equal,  round, and reactive to light.  Neck: Normal range of motion.  Pulmonary/Chest: He has wheezes.  Abdominal: He exhibits no distension.  Musculoskeletal: Normal range of motion.  Neurological: He is alert and oriented to person, place, and time.  Skin: Skin is warm.  Psychiatric: He has a normal mood and affect.  Nursing note and vitals reviewed.   ED Course  Procedures (including critical care time)  Labs Review Labs Reviewed - No data to display  Imaging Review No results found.   Visual Acuity Review  Right Eye Distance:   Left Eye Distance:   Bilateral Distance:    Right Eye Near:   Left Eye Near:    Bilateral Near:         MDM Pt given duoneb.  He reports no relief.  Pt given albuterol  neb.   Pt had improved 02 sats.  Pt increased from 89 to 93%.  He still feels bad.  I discussed options with pt.  He will go to [facility] ED for further treatment.  Pt may need admission.  I spoke with Dr. Madilyn Hook at [facility] to let her know about pt.   1. Asthma with acute exacerbation, moderate persistent        Elson Areas, [place] 02/13/15 1824

## 2015-02-14 ENCOUNTER — Encounter (HOSPITAL_COMMUNITY): Payer: Self-pay | Admitting: Internal Medicine

## 2015-02-14 ENCOUNTER — Observation Stay (HOSPITAL_COMMUNITY): Payer: BLUE CROSS/BLUE SHIELD

## 2015-02-14 DIAGNOSIS — Z87891 Personal history of nicotine dependence: Secondary | ICD-10-CM | POA: Diagnosis not present

## 2015-02-14 DIAGNOSIS — J129 Viral pneumonia, unspecified: Secondary | ICD-10-CM | POA: Diagnosis present

## 2015-02-14 DIAGNOSIS — R011 Cardiac murmur, unspecified: Secondary | ICD-10-CM

## 2015-02-14 DIAGNOSIS — Z809 Family history of malignant neoplasm, unspecified: Secondary | ICD-10-CM | POA: Diagnosis not present

## 2015-02-14 DIAGNOSIS — J9601 Acute respiratory failure with hypoxia: Secondary | ICD-10-CM | POA: Diagnosis not present

## 2015-02-14 DIAGNOSIS — G43909 Migraine, unspecified, not intractable, without status migrainosus: Secondary | ICD-10-CM | POA: Diagnosis present

## 2015-02-14 LAB — HEPATIC FUNCTION PANEL
ALT: 37 U/L (ref 17–63)
AST: 34 U/L (ref 15–41)
Albumin: 3.5 g/dL (ref 3.5–5.0)
Alkaline Phosphatase: 93 U/L (ref 38–126)
Bilirubin, Direct: <0.1 mg/dL — ABNORMAL LOW (ref 0.1–0.5)
TOTAL PROTEIN: 7 g/dL (ref 6.5–8.1)
Total Bilirubin: 0.2 mg/dL — ABNORMAL LOW (ref 0.3–1.2)

## 2015-02-14 LAB — RAPID URINE DRUG SCREEN, HOSP PERFORMED
Amphetamines: NOT DETECTED
BARBITURATES: POSITIVE — AB
BENZODIAZEPINES: NOT DETECTED
COCAINE: NOT DETECTED
Opiates: POSITIVE — AB
Tetrahydrocannabinol: NOT DETECTED

## 2015-02-14 LAB — BASIC METABOLIC PANEL
ANION GAP: 8 (ref 5–15)
BUN: 15 mg/dL (ref 6–20)
CALCIUM: 9.4 mg/dL (ref 8.9–10.3)
CO2: 24 mmol/L (ref 22–32)
CREATININE: 0.69 mg/dL (ref 0.61–1.24)
Chloride: 108 mmol/L (ref 101–111)
GLUCOSE: 129 mg/dL — AB (ref 65–99)
Potassium: 3.9 mmol/L (ref 3.5–5.1)
Sodium: 140 mmol/L (ref 135–145)

## 2015-02-14 LAB — CBC
HCT: 40.4 % (ref 39.0–52.0)
Hemoglobin: 13.1 g/dL (ref 13.0–17.0)
MCH: 30.3 pg (ref 26.0–34.0)
MCHC: 32.4 g/dL (ref 30.0–36.0)
MCV: 93.5 fL (ref 78.0–100.0)
PLATELETS: 249 10*3/uL (ref 150–400)
RBC: 4.32 MIL/uL (ref 4.22–5.81)
RDW: 12.6 % (ref 11.5–15.5)
WBC: 7.9 10*3/uL (ref 4.0–10.5)

## 2015-02-14 LAB — URINALYSIS, ROUTINE W REFLEX MICROSCOPIC
BILIRUBIN URINE: NEGATIVE
Glucose, UA: NEGATIVE mg/dL
Hgb urine dipstick: NEGATIVE
KETONES UR: NEGATIVE mg/dL
LEUKOCYTES UA: NEGATIVE
NITRITE: NEGATIVE
PROTEIN: NEGATIVE mg/dL
Specific Gravity, Urine: 1.026 (ref 1.005–1.030)
pH: 6 (ref 5.0–8.0)

## 2015-02-14 LAB — MAGNESIUM: MAGNESIUM: 2.1 mg/dL (ref 1.7–2.4)

## 2015-02-14 LAB — INFLUENZA PANEL BY PCR (TYPE A & B)
H1N1FLUPCR: NOT DETECTED
INFLAPCR: NEGATIVE
Influenza B By PCR: NEGATIVE

## 2015-02-14 LAB — TSH: TSH: 0.264 u[IU]/mL — AB (ref 0.350–4.500)

## 2015-02-14 MED ORDER — ALBUTEROL SULFATE (2.5 MG/3ML) 0.083% IN NEBU: 2.5000 mg | INHALATION_SOLUTION | RESPIRATORY_TRACT | Status: DC | PRN

## 2015-02-14 MED ORDER — FAMOTIDINE 20 MG PO TABS
20.0000 mg | ORAL_TABLET | Freq: Two times a day (BID) | ORAL | Status: DC
  Administered 2015-02-14 – 2015-02-16 (×4): 20 mg via ORAL
  Filled 2015-02-14 (×5): qty 1

## 2015-02-14 MED ORDER — IPRATROPIUM-ALBUTEROL 0.5-2.5 (3) MG/3ML IN SOLN
3.0000 mL | Freq: Three times a day (TID) | RESPIRATORY_TRACT | Status: DC
  Administered 2015-02-14: 3 mL via RESPIRATORY_TRACT
  Filled 2015-02-14: qty 3

## 2015-02-14 MED ORDER — MAGNESIUM SULFATE 2 GM/50ML IV SOLN
2.0000 g | Freq: Once | INTRAVENOUS | Status: AC
  Administered 2015-02-14: 2 g via INTRAVENOUS
  Filled 2015-02-14: qty 50

## 2015-02-14 MED ORDER — GUAIFENESIN ER 600 MG PO TB12
600.0000 mg | ORAL_TABLET | Freq: Two times a day (BID) | ORAL | Status: DC
  Administered 2015-02-14 – 2015-02-16 (×5): 600 mg via ORAL
  Filled 2015-02-14 (×5): qty 1

## 2015-02-14 MED ORDER — METHYLPREDNISOLONE SODIUM SUCC 125 MG IJ SOLR
60.0000 mg | Freq: Three times a day (TID) | INTRAMUSCULAR | Status: DC
  Administered 2015-02-14 – 2015-02-16 (×6): 60 mg via INTRAVENOUS
  Filled 2015-02-14 (×9): qty 0.96

## 2015-02-14 MED ORDER — IPRATROPIUM-ALBUTEROL 0.5-2.5 (3) MG/3ML IN SOLN
3.0000 mL | Freq: Four times a day (QID) | RESPIRATORY_TRACT | Status: DC
  Administered 2015-02-14: 3 mL via RESPIRATORY_TRACT
  Filled 2015-02-14: qty 3

## 2015-02-14 MED ORDER — IPRATROPIUM-ALBUTEROL 0.5-2.5 (3) MG/3ML IN SOLN
3.0000 mL | Freq: Three times a day (TID) | RESPIRATORY_TRACT | Status: DC
  Administered 2015-02-15 – 2015-02-16 (×4): 3 mL via RESPIRATORY_TRACT
  Filled 2015-02-14 (×4): qty 3

## 2015-02-14 NOTE — Progress Notes (Signed)
  Echocardiogram 2D Echocardiogram has been performed.  Leta JunglingCooper, Sheran Newstrom M 02/14/2015, 10:56 AM

## 2015-02-14 NOTE — Progress Notes (Signed)
PROGRESS NOTE  Marc Liu EAV:[id number] DOB: [birth year] DOA: 02/13/2015 PCP: Verl Bangs, MD  HPI/Recap of past 24 hours:  Still wheezing, nonproductive cough, on 2liter oxygen  Assessment/Plan: Principal Problem:   Viral pneumonia Active Problems:   Acute respiratory failure with hypoxia (HCC)   Systolic murmur  Pneumonia with diffuse bilateral wheezing, likely vital pna per cxr, hiv pending, start nebs/steroids/mucinex iv mag given due to significant wheezing. Continue oxygen supplement  Migraine headache, stable on home meds.    Code Status: full  Family Communication: patient in room and wife over the phone  Disposition Plan: home when medically stable in 1-2 days   Consultants:  none  Procedures:  none  Antibiotics:  none   Objective: BP 106/66 mmHg  Pulse 76  Temp(Src) 97.6 F (36.4 C) (Oral)  Resp 20  Ht  (1.702 m)  Wt 200 lb 13.4 oz (91.1 kg)  BMI 31.45 kg/m2  SpO2 99%  Intake/Output Summary (Last 24 hours) at 02/14/15 1820 Last data filed at 02/14/15 0004  Gross per 24 hour  Intake    240 ml  Output      0 ml  Net    240 ml   Filed Weights   02/13/15 2350  Weight: 200 lb 13.4 oz (91.1 kg)    Exam:   General:  Look not comfortable  Cardiovascular: RRR  Respiratory: diffuse bilateral whezing  Abdomen: Soft/ND/NT, positive BS  Musculoskeletal: No Edema  Neuro: aaox3  Skin: + tattoo  Data Reviewed: Basic Metabolic Panel:  Recent Labs Lab 02/13/15 1953 02/14/15 0550 02/14/15 0915  NA 139 140  --   K 4.0 3.9  --   CL 107 108  --   CO2 24 24  --   GLUCOSE 161* 129*  --   BUN 15 15  --   CREATININE 0.78 0.69  --   CALCIUM 9.9 9.4  --   MG  --   --  2.1   Liver Function Tests:  Recent Labs Lab 02/14/15 0915  AST 34  ALT 37  ALKPHOS 93  BILITOT 0.2*  PROT 7.0  ALBUMIN 3.5   No results for input(s): LIPASE, AMYLASE in the last 168 hours. No results for input(s): AMMONIA in the  last 168 hours. CBC:  Recent Labs Lab 02/13/15 1953 02/14/15 0550  WBC 4.0 7.9  NEUTROABS 3.2  --   HGB 13.9 13.1  HCT 42.6 40.4  MCV 93.0 93.5  PLT 237 249   Cardiac Enzymes:   No results for input(s): CKTOTAL, CKMB, CKMBINDEX, TROPONINI in the last 168 hours. BNP (last 3 results) No results for input(s): BNP in the last 8760 hours.  ProBNP (last 3 results) No results for input(s): PROBNP in the last 8760 hours.  CBG: No results for input(s): GLUCAP in the last 168 hours.  No results found for this or any previous visit (from the past 240 hour(s)).   Studies: Dg Chest 2 View  02/13/2015  CLINICAL DATA:  Shortness of breath and nonproductive cough. Patient received breathing treatment. Fevers and night sweats for 6 days. EXAM: CHEST  2 VIEW COMPARISON:  07/10/2014. FINDINGS: Low lung volumes. Normal cardiomediastinal silhouette. Increased perihilar markings suggesting viral pneumonitis. No lobar consolidation. No effusion or pneumothorax. Worsening aeration from priors. No osseous findings. IMPRESSION: Increased perihilar markings suggesting viral pneumonitis. No lobar consolidation. Worsening aeration from priors. Electronically Signed   By: Elsie Stain M.D.   On: 02/13/2015 20:44    Scheduled Meds: .  carbamazepine  200 mg Oral TID  . enoxaparin (LOVENOX) injection  40 mg Subcutaneous QHS  . famotidine  20 mg Oral BID  . guaiFENesin  600 mg Oral BID  . ipratropium-albuterol  3 mL Nebulization Q6H  . methylPREDNISolone (SOLU-MEDROL) injection  60 mg Intravenous 3 times per day  . oseltamivir  75 mg Oral BID    Continuous Infusions: . sodium chloride 100 mL/hr at 02/14/15 0059     Time spent: 25mins  Argie Applegate MD, PhD  [facility] Pager [phone]. If 7PM-7AM, please contact night-coverage at [email or web address], password [email or web address] 02/14/2015, 6:20 PM  LOS: 1 day

## 2015-02-15 LAB — HEMOGLOBIN A1C
Hgb A1c MFr Bld: 5.5 % (ref 4.8–5.6)
Mean Plasma Glucose: 111 mg/dL

## 2015-02-15 LAB — EXPECTORATED SPUTUM ASSESSMENT W REFEX TO RESP CULTURE

## 2015-02-15 LAB — BASIC METABOLIC PANEL
Anion gap: 5 (ref 5–15)
BUN: 14 mg/dL (ref 6–20)
CO2: 25 mmol/L (ref 22–32)
CREATININE: 0.69 mg/dL (ref 0.61–1.24)
Calcium: 8.6 mg/dL — ABNORMAL LOW (ref 8.9–10.3)
Chloride: 110 mmol/L (ref 101–111)
Glucose, Bld: 126 mg/dL — ABNORMAL HIGH (ref 65–99)
POTASSIUM: 4.4 mmol/L (ref 3.5–5.1)
SODIUM: 140 mmol/L (ref 135–145)

## 2015-02-15 LAB — HIV ANTIBODY (ROUTINE TESTING W REFLEX): HIV Screen 4th Generation wRfx: NONREACTIVE

## 2015-02-15 LAB — T4, FREE: FREE T4: 0.86 ng/dL (ref 0.61–1.12)

## 2015-02-15 LAB — EXPECTORATED SPUTUM ASSESSMENT W GRAM STAIN, RFLX TO RESP C

## 2015-02-15 NOTE — Progress Notes (Signed)
PROGRESS NOTE  Marc Liu ZOX:[id number] DOB: 02-Dec-1975 DOA: 02/13/2015 PCP: Verl Bangs, MD  HPI/Recap of past 24 hours:  Feeling better, still with DOE, nonproductive cough, on 2liter oxygen, wife at bedside.  Assessment/Plan: Principal Problem:   Viral pneumonia Active Problems:   Acute respiratory failure with hypoxia (HCC)   Systolic murmur  Pneumonia with diffuse bilateral wheezing, likely vital pna per cxr, hiv negative, start nebs/steroids/mucinex iv mag given due to significant wheezing. Continue oxygen supplement 11/22 improving, less wheezing, continue current regimen, wean oxygen  Migraine headache, stable on home meds.    Code Status: full  Family Communication: patient and wife   Disposition Plan: home when medically stable in 1-2 days   Consultants:  none  Procedures:  none  Antibiotics:  none   Objective: BP 119/74 mmHg  Pulse 79  Temp(Src) 97.7 F (36.5 C) (Oral)  Resp 20  Ht  (1.702 m)  Wt 200 lb 13.4 oz (91.1 kg)  BMI 31.45 kg/m2  SpO2 96%  Intake/Output Summary (Last 24 hours) at 02/15/15 1851 Last data filed at 02/15/15 1800  Gross per 24 hour  Intake   1340 ml  Output      0 ml  Net   1340 ml   Filed Weights   02/13/15 2350  Weight: 200 lb 13.4 oz (91.1 kg)    Exam:   General:  NAD  Cardiovascular: RRR  Respiratory: less diffuse bilateral whezing  Abdomen: Soft/ND/NT, positive BS  Musculoskeletal: No Edema  Neuro: aaox3  Skin: + tattoo  Data Reviewed: Basic Metabolic Panel:  Recent Labs Lab 02/13/15 1953 02/14/15 0550 02/14/15 0915 02/15/15 0550  NA 139 140  --  140  K 4.0 3.9  --  4.4  CL 107 108  --  110  CO2 24 24  --  25  GLUCOSE 161* 129*  --  126*  BUN 15 15  --  14  CREATININE 0.78 0.69  --  0.69  CALCIUM 9.9 9.4  --  8.6*  MG  --   --  2.1  --    Liver Function Tests:  Recent Labs Lab 02/14/15 0915  AST 34  ALT 37  ALKPHOS 93  BILITOT 0.2*  PROT  7.0  ALBUMIN 3.5   No results for input(s): LIPASE, AMYLASE in the last 168 hours. No results for input(s): AMMONIA in the last 168 hours. CBC:  Recent Labs Lab 02/13/15 1953 02/14/15 0550  WBC 4.0 7.9  NEUTROABS 3.2  --   HGB 13.9 13.1  HCT 42.6 40.4  MCV 93.0 93.5  PLT 237 249   Cardiac Enzymes:   No results for input(s): CKTOTAL, CKMB, CKMBINDEX, TROPONINI in the last 168 hours. BNP (last 3 results) No results for input(s): BNP in the last 8760 hours.  ProBNP (last 3 results) No results for input(s): PROBNP in the last 8760 hours.  CBG: No results for input(s): GLUCAP in the last 168 hours.  No results found for this or any previous visit (from the past 240 hour(s)).   Studies: No results found.  Scheduled Meds: . carbamazepine  200 mg Oral TID  . enoxaparin (LOVENOX) injection  40 mg Subcutaneous QHS  . famotidine  20 mg Oral BID  . guaiFENesin  600 mg Oral BID  . ipratropium-albuterol  3 mL Nebulization TID  . methylPREDNISolone (SOLU-MEDROL) injection  60 mg Intravenous 3 times per day  . oseltamivir  75 mg Oral BID    Continuous Infusions: .  sodium chloride 100 mL/hr at 02/15/15 1757     Time spent: 25mins  Fabianna Keats MD, PhD  [facility] Pager [phone]. If 7PM-7AM, please contact night-coverage at [email or web address], password [email or web address] 02/15/2015, 6:51 PM  LOS: 2 days

## 2015-02-16 MED ORDER — PREDNISONE 10 MG PO TABS: ORAL_TABLET | ORAL | Status: DC

## 2015-02-16 MED ORDER — OXYCODONE HCL 5 MG PO TABS: 5.0000 mg | ORAL_TABLET | ORAL | Status: DC | PRN

## 2015-02-16 MED ORDER — GUAIFENESIN ER 600 MG PO TB12: 600.0000 mg | ORAL_TABLET | Freq: Two times a day (BID) | ORAL | Status: DC

## 2015-02-16 MED ORDER — OSELTAMIVIR PHOSPHATE 75 MG PO CAPS: 75.0000 mg | ORAL_CAPSULE | Freq: Two times a day (BID) | ORAL | Status: DC

## 2015-02-16 MED ORDER — HYDROCOD POLST-CPM POLST ER 10-8 MG/5ML PO SUER: 5.0000 mL | Freq: Two times a day (BID) | ORAL | Status: DC | PRN

## 2015-02-16 MED ORDER — ALBUTEROL SULFATE (2.5 MG/3ML) 0.083% IN NEBU: 2.5000 mg | INHALATION_SOLUTION | RESPIRATORY_TRACT | Status: DC | PRN

## 2015-02-16 MED ORDER — LEVOFLOXACIN 500 MG PO TABS: 500.0000 mg | ORAL_TABLET | Freq: Every day | ORAL | Status: DC

## 2015-02-16 MED ORDER — IPRATROPIUM-ALBUTEROL 0.5-2.5 (3) MG/3ML IN SOLN: 3.0000 mL | Freq: Three times a day (TID) | RESPIRATORY_TRACT | Status: DC

## 2015-02-16 NOTE — Discharge Summary (Signed)
Physician Discharge Summary  Marc Liu ZOX:[id number] DOB: 1975-06-14 DOA: 02/13/2015  PCP: Verl BangsADIONTCHENKO, ALEXEI, MD  Admit date: 02/13/2015 Discharge date: 02/16/2015  Recommendations for Outpatient Follow-up:  1. Pt will need to follow up with PCP in 2-3 weeks post discharge 2. Please obtain BMP to evaluate electrolytes and kidney function  Discharge Diagnoses:  Principal Problem:   Viral pneumonia Active Problems:   Acute respiratory failure with hypoxia (HCC)   Systolic murmur  Discharge Condition: Stable  Diet recommendation: Heart healthy diet discussed in details   History of present illness:  39 y.o. male with a history of Migraines who presented to the ED with complaints of fever chills night sweats and myalgias along with Non Productive Cough with Chest Congestion and worsening SOB x 6 days. He had been seen in an UCC and placed on Levaquin and Prednisone and Hycodan Cough syrup 2 days ago, but has continued to worsen.  Hospital Course:  Principal Problem:   Viral pneumonia - clinically improving - continue Levaquin and Tamiflu upon discharge to complete therapy - pt wants to go home, advised to also continue Prednisone taper as instructed  - continue BD's scheduled and as needed   Active Problems:   Acute respiratory failure with hypoxia (HCC) - secondary to the above - management as noted above     Systolic murmur  Procedures/Studies: Dg Chest 2 View  02/13/2015  CLINICAL DATA:  Shortness of breath and nonproductive cough. Patient received breathing treatment. Fevers and night sweats for 6 days. EXAM: CHEST  2 VIEW COMPARISON:  07/10/2014. FINDINGS: Low lung volumes. Normal cardiomediastinal silhouette. Increased perihilar markings suggesting viral pneumonitis. No lobar consolidation. No effusion or pneumothorax. Worsening aeration from priors. No osseous findings. IMPRESSION: Increased perihilar markings suggesting viral pneumonitis. No lobar  consolidation. Worsening aeration from priors. Electronically Signed   By: Elsie StainJohn T Curnes M.D.   On: 02/13/2015 20:44    Discharge Exam: Filed Vitals:   02/16/15 0200 02/16/15 0437  BP: 143/99 128/77  Pulse: 79 63  Temp: 98.1 F (36.7 C) 98.2 F (36.8 C)  Resp: 20 20   Filed Vitals:   02/15/15 1948 02/15/15 2223 02/16/15 0200 02/16/15 0437  BP:  138/91 143/99 128/77  Pulse:  76 79 63  Temp:  97.3 F (36.3 C) 98.1 F (36.7 C) 98.2 F (36.8 C)  TempSrc:  Oral Oral Oral  Resp:  20 20 20   Height:      Weight:      SpO2: 96% 96% 96% 95%    General: Pt is alert, follows commands appropriately, not in acute distress Cardiovascular: Regular rate and rhythm, SEM 3/6, no rubs, no gallops Respiratory: Clear to auscultation bilaterally, no wheezing, diminished breath sounds at bases  Abdominal: Soft, non tender, non distended, bowel sounds +, no guarding Extremities: no cyanosis, pulses palpable bilaterally DP and PT Neuro: Grossly nonfocal  Discharge Instructions     Medication List    STOP taking these medications        HYDROcodone-homatropine 5-1.5 MG/5ML syrup  Commonly known as:  HYCODAN     naproxen 500 MG tablet  Commonly known as:  NAPROSYN     oxyCODONE-acetaminophen 5-325 MG tablet  Commonly known as:  PERCOCET/ROXICET      TAKE these medications        albuterol (2.5 MG/3ML) 0.083% nebulizer solution  Commonly known as:  PROVENTIL  Take 3 mLs (2.5 mg total) by nebulization every 4 (four) hours as needed for wheezing or shortness of  breath.     carbamazepine 200 MG tablet  Commonly known as:  TEGRETOL  Take 200 mg by mouth 3 (three) times daily.     chlorpheniramine-HYDROcodone 10-8 MG/5ML Suer  Commonly known as:  TUSSIONEX  Take 5 mLs by mouth every 12 (twelve) hours as needed for cough.     guaiFENesin 600 MG 12 hr tablet  Commonly known as:  MUCINEX  Take 1 tablet (600 mg total) by mouth 2 (two) times daily.     ipratropium-albuterol 0.5-2.5  (3) MG/3ML Soln  Commonly known as:  DUONEB  Take 3 mLs by nebulization 3 (three) times daily.     levofloxacin 500 MG tablet  Commonly known as:  LEVAQUIN  Take 1 tablet (500 mg total) by mouth daily.     oseltamivir 75 MG capsule  Commonly known as:  TAMIFLU  Take 1 capsule (75 mg total) by mouth 2 (two) times daily.     oxyCODONE 5 MG immediate release tablet  Commonly known as:  Oxy IR/ROXICODONE  Take 1 tablet (5 mg total) by mouth every 4 (four) hours as needed for moderate pain.     predniSONE 10 MG tablet  Commonly known as:  DELTASONE  Take 50 mg tablet and taper down by 10 mg daily until completed     rizatriptan 10 MG tablet  Commonly known as:  MAXALT  Take 1 tablet by mouth as needed. For migraine pain           Follow-up Information    Follow up with Verl Bangs, MD.   Specialty:  Family Medicine   Contact information:   [address] [place] [address] [phone]        The results of significant diagnostics from this hospitalization (including imaging, microbiology, ancillary and laboratory) are listed below for reference.     Microbiology: Recent Results (from the past 240 hour(s))  Culture, expectorated sputum-assessment     Status: None   Collection Time: 02/15/15 10:27 PM  Result Value Ref Range Status   Specimen Description SPUTUM  Final   Special Requests NONE  Final   Sputum evaluation   Final    MICROSCOPIC FINDINGS SUGGEST THAT THIS SPECIMEN IS NOT REPRESENTATIVE OF LOWER RESPIRATORY SECRETIONS. PLEASE RECOLLECT. CORRECTED RESULTS CALLED TO: C.YOUNG RN AT 2326 ON 11.22.16 BY W.BUCHHOLZ.   Report Status 02/15/2015 FINAL  Final     Labs: Basic Metabolic Panel:  Recent Labs Lab 02/13/15 1953 02/14/15 0550 02/14/15 0915 02/15/15 0550  NA 139 140  --  140  K 4.0 3.9  --  4.4  CL 107 108  --  110  CO2 24 24  --  25  GLUCOSE 161* 129*  --  126*  BUN 15 15  --  14  CREATININE 0.78 0.69  --  0.69  CALCIUM 9.9 9.4   --  8.6*  MG  --   --  2.1  --    Liver Function Tests:  Recent Labs Lab 02/14/15 0915  AST 34  ALT 37  ALKPHOS 93  BILITOT 0.2*  PROT 7.0  ALBUMIN 3.5   CBC:  Recent Labs Lab 02/13/15 1953 02/14/15 0550  WBC 4.0 7.9  NEUTROABS 3.2  --   HGB 13.9 13.1  HCT 42.6 40.4  MCV 93.0 93.5  PLT 237 249   SIGNED: Time coordinating discharge:  30 minutes  Debbora Presto, MD  [facility] 02/16/2015, 7:50 AM Pager [phone]  If 7PM-7AM, please contact night-coverage [email or web address] Password [email or web address]

## 2015-02-16 NOTE — Progress Notes (Signed)
Pulse ox 94% on RA while ambulating.

## 2015-02-16 NOTE — Progress Notes (Signed)
Pt discharged via wheelchair. Discharge instructions were reviewed with the pt. Paper scripts were given to the pt. Pt had no complaints or questions at discharge. Dorien Bessent W Oma Marzan, RN

## 2015-02-16 NOTE — Discharge Instructions (Signed)

## 2017-07-12 ENCOUNTER — Encounter (HOSPITAL_BASED_OUTPATIENT_CLINIC_OR_DEPARTMENT_OTHER): Payer: Self-pay

## 2017-07-12 ENCOUNTER — Other Ambulatory Visit: Payer: Self-pay

## 2017-07-12 DIAGNOSIS — Z79899 Other long term (current) drug therapy: Secondary | ICD-10-CM | POA: Insufficient documentation

## 2017-07-12 DIAGNOSIS — G43909 Migraine, unspecified, not intractable, without status migrainosus: Secondary | ICD-10-CM | POA: Insufficient documentation

## 2017-07-12 DIAGNOSIS — Z87891 Personal history of nicotine dependence: Secondary | ICD-10-CM | POA: Insufficient documentation

## 2017-07-12 NOTE — ED Triage Notes (Signed)
Pt reports generalized migraine headache x2 months. Pts Rx meds providing no relief. No neuro deficits. Pt A+OX4, ambulatory independently.

## 2017-07-13 ENCOUNTER — Other Ambulatory Visit: Payer: Self-pay

## 2017-07-13 ENCOUNTER — Emergency Department (HOSPITAL_BASED_OUTPATIENT_CLINIC_OR_DEPARTMENT_OTHER)
Admission: EM | Admit: 2017-07-13 | Discharge: 2017-07-13 | Disposition: A | Discharge status: Home / Self Care | Payer: Self-pay | Attending: Emergency Medicine | Admitting: Emergency Medicine

## 2017-07-13 ENCOUNTER — Emergency Department (HOSPITAL_BASED_OUTPATIENT_CLINIC_OR_DEPARTMENT_OTHER): Payer: Self-pay

## 2017-07-13 DIAGNOSIS — G43909 Migraine, unspecified, not intractable, without status migrainosus: Secondary | ICD-10-CM

## 2017-07-13 LAB — CBC WITH DIFFERENTIAL/PLATELET
BASOS ABS: 0 10*3/uL (ref 0.0–0.1)
BASOS PCT: 0 %
Eosinophils Absolute: 0.3 10*3/uL (ref 0.0–0.7)
Eosinophils Relative: 3 %
HEMATOCRIT: 43.4 % (ref 39.0–52.0)
HEMOGLOBIN: 15 g/dL (ref 13.0–17.0)
Lymphocytes Relative: 28 %
Lymphs Abs: 2.4 10*3/uL (ref 0.7–4.0)
MCH: 31.7 pg (ref 26.0–34.0)
MCHC: 34.6 g/dL (ref 30.0–36.0)
MCV: 91.8 fL (ref 78.0–100.0)
MONOS PCT: 12 %
Monocytes Absolute: 1.1 10*3/uL — ABNORMAL HIGH (ref 0.1–1.0)
NEUTROS ABS: 4.8 10*3/uL (ref 1.7–7.7)
NEUTROS PCT: 57 %
Platelets: 180 10*3/uL (ref 150–400)
RBC: 4.73 MIL/uL (ref 4.22–5.81)
RDW: 13.4 % (ref 11.5–15.5)
WBC: 8.5 10*3/uL (ref 4.0–10.5)

## 2017-07-13 LAB — COMPREHENSIVE METABOLIC PANEL
ALBUMIN: 4 g/dL (ref 3.5–5.0)
ALK PHOS: 89 U/L (ref 38–126)
ALT: 25 U/L (ref 17–63)
AST: 27 U/L (ref 15–41)
Anion gap: 10 (ref 5–15)
BILIRUBIN TOTAL: 0.4 mg/dL (ref 0.3–1.2)
BUN: 13 mg/dL (ref 6–20)
CALCIUM: 9 mg/dL (ref 8.9–10.3)
CO2: 23 mmol/L (ref 22–32)
Chloride: 104 mmol/L (ref 101–111)
Creatinine, Ser: 1.02 mg/dL (ref 0.61–1.24)
GFR calc Af Amer: >60 mL/min (ref >60)
GFR calc non Af Amer: >60 mL/min (ref >60)
GLUCOSE: 95 mg/dL (ref 65–99)
Potassium: 3.4 mmol/L — ABNORMAL LOW (ref 3.5–5.1)
Sodium: 137 mmol/L (ref 135–145)
TOTAL PROTEIN: 7.3 g/dL (ref 6.5–8.1)

## 2017-07-13 LAB — CARBAMAZEPINE LEVEL, TOTAL: Carbamazepine Lvl: 7.5 ug/mL (ref 4.0–12.0)

## 2017-07-13 MED ORDER — DIPHENHYDRAMINE HCL 50 MG/ML IJ SOLN
25.0000 mg | Freq: Once | INTRAMUSCULAR | Status: AC
  Administered 2017-07-13: 25 mg via INTRAVENOUS
  Filled 2017-07-13: qty 1

## 2017-07-13 MED ORDER — SODIUM CHLORIDE 0.9 % IV BOLUS
1000.0000 mL | Freq: Once | INTRAVENOUS | Status: AC
  Administered 2017-07-13: 1000 mL via INTRAVENOUS

## 2017-07-13 MED ORDER — METOCLOPRAMIDE HCL 5 MG/ML IJ SOLN
10.0000 mg | Freq: Once | INTRAMUSCULAR | Status: AC
  Administered 2017-07-13: 10 mg via INTRAVENOUS
  Filled 2017-07-13: qty 2

## 2017-07-13 MED ORDER — DEXAMETHASONE SODIUM PHOSPHATE 10 MG/ML IJ SOLN
10.0000 mg | Freq: Once | INTRAMUSCULAR | Status: AC
  Administered 2017-07-13: 10 mg via INTRAVENOUS
  Filled 2017-07-13: qty 1

## 2017-07-13 MED ORDER — KETOROLAC TROMETHAMINE 30 MG/ML IJ SOLN
30.0000 mg | Freq: Once | INTRAMUSCULAR | Status: AC
  Administered 2017-07-13: 30 mg via INTRAVENOUS
  Filled 2017-07-13: qty 1

## 2017-07-13 NOTE — ED Notes (Signed)
Labs collected and sent to laboratory if need it.

## 2017-07-13 NOTE — ED Provider Notes (Signed)
[facility] EMERGENCY DEPARTMENT Provider Note   CSN: [id number] Arrival date & time: 07/12/17  2317     History   Chief Complaint Chief Complaint  Patient presents with  . Migraine    HPI Marc Liu is a 42 y.o. male.  Patient with history of migraine headaches presenting with persistent relisting migraines over the past 2 months.  He saw his neurologist last week and was given Imitrex and continue his carbamazepine as well as a prednisone taper.  He has been scheduled for an MRI.  Reports diffuse headache that starts behind his ears and radiates to his entire head with gradual onset.  Associated photophobia and phonophobia.  No fever.  No focal weakness, numbness or tingling.  No vision changes.  No difficulty speaking or difficulty swallowing.  Denies thunderclap onset.  States compliance with his medications.  Headaches have been relatively well controlled for a number of years until the past month.  The history is provided by the patient.  Migraine  Associated symptoms include headaches. Pertinent negatives include no chest pain, no abdominal pain and no shortness of breath.    Past Medical History:  Diagnosis Date  . Bipolar disorder (HCC)   . Depression   . Migraines   . Shortness of breath dyspnea   . Sinusitis     Patient Active Problem List   Diagnosis Date Noted  . Systolic murmur 02/14/2015  . Viral pneumonia 02/13/2015  . Acute respiratory failure with hypoxia (HCC) 02/13/2015  . ABRASION, FINGER, INFECTED 11/02/2008    Past Surgical History:  Procedure Laterality Date  . right lower leg tendons          Home Medications    Prior to Admission medications   Medication Sig Start Date End Date Taking? Authorizing Provider  albuterol (PROVENTIL) (2.5 MG/3ML) 0.083% nebulizer solution Take 3 mLs (2.5 mg total) by nebulization every 4 (four) hours as needed for wheezing or shortness of breath. 02/16/15 02/16/16  Dorothea OgleMyers, Iskra M, MD    carbamazepine (TEGRETOL) 200 MG tablet Take 200 mg by mouth 3 (three) times daily. 05/20/14 05/20/15  [provider]  chlorpheniramine-HYDROcodone (TUSSIONEX) 10-8 MG/5ML SUER Take 5 mLs by mouth every 12 (twelve) hours as needed for cough. 02/16/15   Dorothea OgleMyers, Iskra M, MD  guaiFENesin (MUCINEX) 600 MG 12 hr tablet Take 1 tablet (600 mg total) by mouth 2 (two) times daily. 02/16/15   Dorothea OgleMyers, Iskra M, MD  ipratropium-albuterol (DUONEB) 0.5-2.5 (3) MG/3ML SOLN Take 3 mLs by nebulization 3 (three) times daily. 02/16/15   Dorothea OgleMyers, Iskra M, MD  levofloxacin (LEVAQUIN) 500 MG tablet Take 1 tablet (500 mg total) by mouth daily. 02/16/15   Dorothea OgleMyers, Iskra M, MD  oseltamivir (TAMIFLU) 75 MG capsule Take 1 capsule (75 mg total) by mouth 2 (two) times daily. 02/16/15   Dorothea OgleMyers, Iskra M, MD  oxyCODONE (OXY IR/ROXICODONE) 5 MG immediate release tablet Take 1 tablet (5 mg total) by mouth every 4 (four) hours as needed for moderate pain. 02/16/15   Dorothea OgleMyers, Iskra M, MD  predniSONE (DELTASONE) 10 MG tablet Take 50 mg tablet and taper down by 10 mg daily until completed 02/16/15   Dorothea OgleMyers, Iskra M, MD  rizatriptan (MAXALT) 10 MG tablet Take 1 tablet by mouth as needed. For migraine pain 05/20/14   [provider]    Family History Family History  Problem Relation Age of Onset  . Cancer Mother   . Cancer Father     Social History Social  History   Tobacco Use  . Smoking status: Former Smoker    Packs/day: 0.50    Years: 25.00    Pack years: 12.50    Last attempt to quit: 05/07/2014    Years since quitting: 3.1  . Smokeless tobacco: Never Used  Substance Use Topics  . Alcohol use: Yes    Comment: occ  . Drug use: No     Allergies   Patient has no known allergies.   Review of Systems Review of Systems  Constitutional: Negative for activity change, appetite change, fatigue and fever.  HENT: Negative for congestion and rhinorrhea.   Eyes: Positive for photophobia.  Respiratory: Negative for  cough, chest tightness and shortness of breath.   Cardiovascular: Negative for chest pain.  Gastrointestinal: Positive for nausea. Negative for abdominal distention, abdominal pain and vomiting.  Genitourinary: Negative for dysuria, hematuria and testicular pain.  Musculoskeletal: Negative for arthralgias and myalgias.  Skin: Negative for rash.  Neurological: Positive for headaches. Negative for dizziness, tremors and weakness.    all other systems are negative except as noted in the HPI and PMH.    Physical Exam Updated Vital Signs BP (!) 134/104 (BP Location: Left Arm)   Pulse 97   Temp 98.2 F (36.8 C) (Oral)   Resp 18   Ht 5\' 7"  (1.702 m)   Wt 99.8 kg (220 lb)   SpO2 94%   BMI 34.46 kg/m   Physical Exam  Constitutional: He is oriented to person, place, and time. He appears well-developed and well-nourished. No distress.  HENT:  Head: Normocephalic and atraumatic.  Mouth/Throat: Oropharynx is clear and moist. No oropharyngeal exudate.  Eyes: Pupils are equal, round, and reactive to light. Conjunctivae and EOM are normal.  photophobic  Neck: Normal range of motion. Neck supple.  No meningismus.  Cardiovascular: Normal rate, regular rhythm, normal heart sounds and intact distal pulses.  No murmur heard. Pulmonary/Chest: Effort normal and breath sounds normal. No respiratory distress.  Abdominal: Soft. There is no tenderness. There is no rebound and no guarding.  Musculoskeletal: Normal range of motion. He exhibits no edema or tenderness.  Neurological: He is alert and oriented to person, place, and time. No cranial nerve deficit. He exhibits normal muscle tone. Coordination normal.  No ataxia on finger to nose bilaterally. No pronator drift. 5/5 strength throughout. CN 2-12 intact.Equal grip strength. Sensation intact.   Skin: Skin is warm.  Psychiatric: He has a normal mood and affect. His behavior is normal.  Nursing note and vitals reviewed.    ED Treatments /  Results  Labs (all labs ordered are listed, but only abnormal results are displayed) Labs Reviewed  CBC WITH DIFFERENTIAL/PLATELET - Abnormal; Notable for the following components:      Result Value   Monocytes Absolute 1.1 (*)    All other components within normal limits  COMPREHENSIVE METABOLIC PANEL - Abnormal; Notable for the following components:   Potassium 3.4 (*)    All other components within normal limits  CARBAMAZEPINE LEVEL, TOTAL    EKG None  Radiology Ct Head Wo Contrast  Result Date: 07/13/2017 CLINICAL DATA:  Migraines all over.  Chronic headache. EXAM: CT HEAD WITHOUT CONTRAST TECHNIQUE: Contiguous axial images were obtained from the base of the skull through the vertex without intravenous contrast. COMPARISON:  None. FINDINGS: BRAIN: The ventricles and sulci are normal. No intraparenchymal hemorrhage, mass effect nor midline shift. No acute large vascular territory infarcts. Grey-white matter distinction is maintained. The basal ganglia are unremarkable.  No abnormal extra-axial fluid collections. Basal cisterns are not effaced and midline. The brainstem and cerebellar hemispheres are without acute abnormalities. VASCULAR: Unremarkable. SKULL/SOFT TISSUES: No skull fracture. No significant soft tissue swelling. ORBITS/SINUSES: The included ocular globes and orbital contents are normal.The mastoid air cells are clear. Chronic mild pansinusitis with mucosal thickening is noted of the paranasal sinuses most severely affecting the ethmoid sinus. OTHER: Right-sided tonsilloliths are noted incidentally. IMPRESSION: Chronic pansinusitis.  No acute intracranial abnormality. Electronically Signed   By: Tollie Eth M.D.   On: 07/13/2017 02:37    Procedures Procedures (including critical care time)  Medications Ordered in ED Medications  sodium chloride 0.9 % bolus 1,000 mL (1,000 mLs Intravenous New Bag/Given 07/13/17 0139)  dexamethasone (DECADRON) injection 10 mg (10 mg  Intravenous Given 07/13/17 0141)  metoCLOPramide (REGLAN) injection 10 mg (10 mg Intravenous Given 07/13/17 0142)  diphenhydrAMINE (BENADRYL) injection 25 mg (25 mg Intravenous Given 07/13/17 0141)  ketorolac (TORADOL) 30 MG/ML injection 30 mg (30 mg Intravenous Given 07/13/17 0141)     Initial Impression / Assessment and Plan / ED Course  I have reviewed the triage vital signs and the nursing notes.  Pertinent labs & imaging results that were available during my care of the patient were reviewed by me and considered in my medical decision making (see chart for details).    Patient with history of chronic migraines presenting with worsening headache over the past month or so.  No focal neurological deficits.  No fever.  No thunderclap onset.  Low suspicion for subarachnoid hemorrhage, meningitis or temporal arteritis.  Patient given IV fluids, Toradol, Reglan, Benadryl and Decadron.  On recheck, patient reports resolution of headache and requesting to go home.  Continue his Tegretol and Imitrex and follow-up with his neurologist for MRI as scheduled.  Return precautions discussed.  Final Clinical Impressions(s) / ED Diagnoses   Final diagnoses:  Migraine without status migrainosus, not intractable, unspecified migraine type    ED Discharge Orders    None       Dola Lunsford, Jeannett Senior, MD 07/13/17 0401

## 2017-07-13 NOTE — Discharge Instructions (Addendum)
Take your medications as prescribed and follow-up with your neurologist.  Return to the ED if you develop new or worsening symptoms.

## 2019-01-23 ENCOUNTER — Encounter (HOSPITAL_BASED_OUTPATIENT_CLINIC_OR_DEPARTMENT_OTHER): Payer: Self-pay | Admitting: *Deleted

## 2019-01-23 ENCOUNTER — Other Ambulatory Visit: Payer: Self-pay

## 2019-01-23 ENCOUNTER — Emergency Department (HOSPITAL_BASED_OUTPATIENT_CLINIC_OR_DEPARTMENT_OTHER): Payer: 59

## 2019-01-23 ENCOUNTER — Emergency Department (HOSPITAL_BASED_OUTPATIENT_CLINIC_OR_DEPARTMENT_OTHER)
Admission: EM | Admit: 2019-01-23 | Discharge: 2019-01-23 | Disposition: A | Discharge status: Home / Self Care | Payer: 59 | Attending: Emergency Medicine | Admitting: Emergency Medicine

## 2019-01-23 DIAGNOSIS — K122 Cellulitis and abscess of mouth: Secondary | ICD-10-CM

## 2019-01-23 DIAGNOSIS — J029 Acute pharyngitis, unspecified: Secondary | ICD-10-CM | POA: Diagnosis present

## 2019-01-23 DIAGNOSIS — Z79899 Other long term (current) drug therapy: Secondary | ICD-10-CM | POA: Diagnosis not present

## 2019-01-23 DIAGNOSIS — Z87891 Personal history of nicotine dependence: Secondary | ICD-10-CM | POA: Diagnosis not present

## 2019-01-23 LAB — CBC WITH DIFFERENTIAL/PLATELET
Abs Immature Granulocytes: 0.02 10*3/uL (ref 0.00–0.07)
Basophils Absolute: 0 10*3/uL (ref 0.0–0.1)
Basophils Relative: 1 %
Eosinophils Absolute: 0 10*3/uL (ref 0.0–0.5)
Eosinophils Relative: 0 %
HCT: 42.9 % (ref 39.0–52.0)
Hemoglobin: 14.1 g/dL (ref 13.0–17.0)
Immature Granulocytes: 0 %
Lymphocytes Relative: 31 %
Lymphs Abs: 1.7 10*3/uL (ref 0.7–4.0)
MCH: 30.7 pg (ref 26.0–34.0)
MCHC: 32.9 g/dL (ref 30.0–36.0)
MCV: 93.5 fL (ref 80.0–100.0)
Monocytes Absolute: 0.8 10*3/uL (ref 0.1–1.0)
Monocytes Relative: 14 %
Neutro Abs: 2.9 10*3/uL (ref 1.7–7.7)
Neutrophils Relative %: 54 %
Platelets: 165 10*3/uL (ref 150–400)
RBC: 4.59 MIL/uL (ref 4.22–5.81)
RDW: 12.3 % (ref 11.5–15.5)
WBC: 5.3 10*3/uL (ref 4.0–10.5)
nRBC: 0 % (ref 0.0–0.2)

## 2019-01-23 LAB — GROUP A STREP BY PCR: Group A Strep by PCR: NOT DETECTED

## 2019-01-23 LAB — BASIC METABOLIC PANEL
Anion gap: 7 (ref 5–15)
BUN: 13 mg/dL (ref 6–20)
CO2: 28 mmol/L (ref 22–32)
Calcium: 9.5 mg/dL (ref 8.9–10.3)
Chloride: 106 mmol/L (ref 98–111)
Creatinine, Ser: 1.02 mg/dL (ref 0.61–1.24)
GFR calc Af Amer: >60 mL/min (ref >60)
GFR calc non Af Amer: >60 mL/min (ref >60)
Glucose, Bld: 101 mg/dL — ABNORMAL HIGH (ref 70–99)
Potassium: 4.2 mmol/L (ref 3.5–5.1)
Sodium: 141 mmol/L (ref 135–145)

## 2019-01-23 MED ORDER — DEXAMETHASONE 6 MG PO TABS: 10.0000 mg | ORAL_TABLET | Freq: Once | ORAL | Status: DC

## 2019-01-23 MED ORDER — CLINDAMYCIN HCL 150 MG PO CAPS: 300.0000 mg | ORAL_CAPSULE | Freq: Three times a day (TID) | ORAL | 0 refills | Status: AC

## 2019-01-23 MED ORDER — IBUPROFEN 600 MG PO TABS
600.0000 mg | ORAL_TABLET | Freq: Four times a day (QID) | ORAL | 0 refills | Status: DC | PRN
Start: 2019-01-23 — End: 2023-12-12

## 2019-01-23 MED ORDER — DEXAMETHASONE SODIUM PHOSPHATE 10 MG/ML IJ SOLN
10.0000 mg | Freq: Once | INTRAMUSCULAR | Status: AC
  Administered 2019-01-23: 10 mg via INTRAVENOUS
  Filled 2019-01-23: qty 1

## 2019-01-23 MED ORDER — IOHEXOL 300 MG/ML  SOLN
100.0000 mL | Freq: Once | INTRAMUSCULAR | Status: AC | PRN
  Administered 2019-01-23: 16:00:00 75 mL via INTRAVENOUS

## 2019-01-23 NOTE — ED Provider Notes (Signed)
[facility] EMERGENCY DEPARTMENT Provider Note   CSN: [id number] Arrival date & time: 01/23/19  1353     History   Chief Complaint No chief complaint on file.   HPI Marc Liu is a 43 y.o. male with history of bipolar disorder, migraines who presents with 3-week history of a bubble sensation in his throat and sore throat.  He has had episodes of not being able when he leans forward.  He feels like something is blocking his airway.  It resolves on its own.  He had one episode of hemoptysis with it.  He reports it started 3 weeks ago when he started having a sore throat at work.  The next day, he had tacos and he had opted for sensation of not being able to breathe and feeling like his throat had a bubble in it or was "flipping around." He denies any fevers.  Patient denies any other symptoms including congestion, ear pain, chest pain, cough.  He has not tried any medication at home for symptoms    HPI  Past Medical History:  Diagnosis Date  . Bipolar disorder (HCC)   . Depression   . Migraines   . Shortness of breath dyspnea   . Sinusitis     Patient Active Problem List   Diagnosis Date Noted  . Systolic murmur 02/14/2015  . Viral pneumonia 02/13/2015  . Acute respiratory failure with hypoxia (HCC) 02/13/2015  . ABRASION, FINGER, INFECTED 11/02/2008    Past Surgical History:  Procedure Laterality Date  . right lower leg tendons          Home Medications    Prior to Admission medications   Medication Sig Start Date End Date Taking? Authorizing Provider  albuterol (PROVENTIL) (2.5 MG/3ML) 0.083% nebulizer solution Take 3 mLs (2.5 mg total) by nebulization every 4 (four) hours as needed for wheezing or shortness of breath. 02/16/15 01/23/19 Yes Dorothea Ogle, MD  atenolol (TENORMIN) 25 MG tablet Take by mouth. 06/16/18 06/17/19 Yes [provider]  chlorpheniramine-HYDROcodone (TUSSIONEX) 10-8 MG/5ML SUER Take 5 mLs by mouth every 12  (twelve) hours as needed for cough. 02/16/15  Yes Dorothea Ogle, MD  omeprazole (PRILOSEC) 40 MG capsule Take by mouth. 11/18/18  Yes [provider]  rizatriptan (MAXALT) 10 MG tablet Take 1 tablet by mouth as needed. For migraine pain 05/20/14  Yes [provider]  carbamazepine (TEGRETOL) 200 MG tablet Take 200 mg by mouth 3 (three) times daily. 05/20/14 05/20/15  [provider]  clindamycin (CLEOCIN) 150 MG capsule Take 2 capsules (300 mg total) by mouth 3 (three) times daily for 10 days. 01/23/19 02/02/19  Commodore Bellew, Waylan Boga, PA-C  guaiFENesin (MUCINEX) 600 MG 12 hr tablet Take 1 tablet (600 mg total) by mouth 2 (two) times daily. 02/16/15   Dorothea Ogle, MD  ibuprofen (ADVIL) 600 MG tablet Take 1 tablet (600 mg total) by mouth every 6 (six) hours as needed. 01/23/19   Ipek Westra, Waylan Boga, PA-C  ipratropium-albuterol (DUONEB) 0.5-2.5 (3) MG/3ML SOLN Take 3 mLs by nebulization 3 (three) times daily. 02/16/15   Dorothea Ogle, MD  levofloxacin (LEVAQUIN) 500 MG tablet Take 1 tablet (500 mg total) by mouth daily. 02/16/15   Dorothea Ogle, MD  oseltamivir (TAMIFLU) 75 MG capsule Take 1 capsule (75 mg total) by mouth 2 (two) times daily. 02/16/15   Dorothea Ogle, MD  oxyCODONE (OXY IR/ROXICODONE) 5 MG immediate release tablet Take 1 tablet (5 mg total) by  mouth every 4 (four) hours as needed for moderate pain. 02/16/15   Dorothea OgleMyers, Iskra M, MD  predniSONE (DELTASONE) 10 MG tablet Take 50 mg tablet and taper down by 10 mg daily until completed 02/16/15   Dorothea OgleMyers, Iskra M, MD    Family History Family History  Problem Relation Age of Onset  . Cancer Mother   . Cancer Father     Social History Social History   Tobacco Use  . Smoking status: Former Smoker    Packs/day: 0.50    Years: 25.00    Pack years: 12.50    Quit date: 05/07/2014    Years since quitting: 4.7  . Smokeless tobacco: Never Used  Substance Use Topics  . Alcohol use: Yes    Comment: occ  . Drug use: No      Allergies   Patient has no known allergies.   Review of Systems Review of Systems  Constitutional: Negative for chills and fever.  HENT: Positive for sore throat and trouble swallowing. Negative for facial swelling.   Respiratory: Positive for shortness of breath (at times).   Cardiovascular: Negative for chest pain.  Gastrointestinal: Negative for abdominal pain, nausea and vomiting.  Genitourinary: Negative for dysuria.  Musculoskeletal: Negative for back pain.  Skin: Negative for rash and wound.  Neurological: Negative for headaches.  Psychiatric/Behavioral: The patient is not nervous/anxious.      Physical Exam Updated Vital Signs BP (!) 124/94 (BP Location: Left Arm)   Pulse 62   Temp 98.2 F (36.8 C) (Oral)   Resp 16   Ht 5' 6.5" (1.689 m)   Wt 99.8 kg   SpO2 97%   BMI 34.98 kg/m   Physical Exam Vitals signs and nursing note reviewed.  Constitutional:      General: He is not in acute distress.    Appearance: He is well-developed. He is not diaphoretic.  HENT:     Head: Normocephalic and atraumatic.     Mouth/Throat:     Pharynx: Uvula midline. Uvula swelling present. No oropharyngeal exudate.     Comments: Significant swelling of the uvula Eyes:     General: No scleral icterus.       Right eye: No discharge.        Left eye: No discharge.     Conjunctiva/sclera: Conjunctivae normal.     Pupils: Pupils are equal, round, and reactive to light.  Neck:     Musculoskeletal: Normal range of motion and neck supple.     Thyroid: No thyromegaly.   Cardiovascular:     Rate and Rhythm: Normal rate and regular rhythm.     Heart sounds: Normal heart sounds. No murmur. No friction rub. No gallop.   Pulmonary:     Effort: Pulmonary effort is normal. No respiratory distress.     Breath sounds: Normal breath sounds. No stridor. No wheezing or rales.  Abdominal:     General: Bowel sounds are normal. There is no distension.     Palpations: Abdomen is soft.      Tenderness: There is no abdominal tenderness. There is no guarding or rebound.  Lymphadenopathy:     Cervical: No cervical adenopathy.  Skin:    General: Skin is warm and dry.     Coloration: Skin is not pale.     Findings: No rash.  Neurological:     Mental Status: He is alert.     Coordination: Coordination normal.      ED Treatments / Results  Labs (all  labs ordered are listed, but only abnormal results are displayed) Labs Reviewed  BASIC METABOLIC PANEL - Abnormal; Notable for the following components:      Result Value   Glucose, Bld 101 (*)    All other components within normal limits  GROUP A STREP BY PCR  CBC WITH DIFFERENTIAL/PLATELET    EKG None  Radiology Ct Soft Tissue Neck W Contrast  Result Date: 01/23/2019 CLINICAL DATA:  Sore throat with difficulty swallowing and breathing. EXAM: CT NECK WITH CONTRAST TECHNIQUE: Multidetector CT imaging of the neck was performed using the standard protocol following the bolus administration of intravenous contrast. CONTRAST:  75mL OMNIPAQUE IOHEXOL 300 MG/ML  SOLN COMPARISON:  None. FINDINGS: Pharynx and larynx: No evidence of mucosal or submucosal mass lesion. No evidence gross tonsillitis or peritonsillar abscess. Slight prominence the uvula which could be normal or indicate mild pharyngitis. No prevertebral/retropharyngeal effusion. Salivary glands: Parotid and submandibular glands are normal. Thyroid: Normal Lymph nodes: No enlarged or low-density nodes on either side of the neck. Vascular: Normal Limited intracranial: Normal Visualized orbits: Normal Mastoids and visualized paranasal sinuses: Ordinary mild mucosal thickening. No advanced sinusitis. Skeleton: Normal Upper chest: Normal Other: None IMPRESSION: No evidence of tonsillitis or peritonsillar abscess. Slight prominence of the uvula which could be normal or indicate mild pharyngitis. Electronically Signed   By: Paulina Fusi M.D.   On: 01/23/2019 15:53    Procedures  Procedures (including critical care time)  Medications Ordered in ED Medications  iohexol (OMNIPAQUE) 300 MG/ML solution 100 mL (75 mLs Intravenous Contrast Given 01/23/19 1535)  dexamethasone (DECADRON) injection 10 mg (10 mg Intravenous Given 01/23/19 1609)     Initial Impression / Assessment and Plan / ED Course  I have reviewed the triage vital signs and the nursing notes.  Pertinent labs & imaging results that were available during my care of the patient were reviewed by me and considered in my medical decision making (see chart for details).  Clinical Course as of Jan 24 1201  Fri Jan 23, 2019  [id number] 43 year old male complaining of some throat pain that is been going on for few weeks.  He has this intermittent sense of it closing up.  He is also coughed up some blood a few times.  No fevers here.  No trismus no neck crepitus.  He has a large uvula.  Getting labs and a neck CT.  Disposition per results of testing.   [MB]    Clinical Course User Index [MB] Terrilee Files, MD       Patient with a "bubble sensation"in his throat for the last 3 weeks.  He has had associated sore throat.  Reports some episodes of feeling like he cannot breathe when he leans forward.  His airway is patent, but does have a very large uvula.  Will treat for uvulitis with clindamycin.  Ibuprofen discussed.  CT soft tissue of the neck is otherwise negative.  Labs are unremarkable.  Strep is negative.  Patient will be referred to ENT.  Strict return precautions given including worsening episodes of feeling like he cannot breathe.  Patient understands and agrees with plan.  Patient also evaluated by my attending, Dr. Charm Barges, who guided the patient's management and agrees with plan.  Final Clinical Impressions(s) / ED Diagnoses   Final diagnoses:  Uvulitis    ED Discharge Orders         Ordered    clindamycin (CLEOCIN) 150 MG capsule  3 times daily  01/23/19 1609    ibuprofen (ADVIL) 600 MG tablet   Every 6 hours PRN     01/23/19 1609           Frederica Kuster, PA-C 01/25/19 1203    Hayden Rasmussen, MD 01/25/19 1456

## 2019-01-23 NOTE — Discharge Instructions (Signed)
Take clindamycin as prescribed until completed.  Take ibuprofen every 6 hours as needed for pain or swelling.  Please see the ENT doctor next week for further evaluation of your symptoms.  Please return the emergency department if you develop any new or worsening symptoms.

## 2019-01-23 NOTE — ED Notes (Signed)
ED Provider at bedside, Dr. Melina Copa

## 2019-01-23 NOTE — ED Triage Notes (Addendum)
3 weeks ago he had sudden onset of sore throat. He felt like food was stuck in his trachea. He feels like he has an injury. He has had several episodes of feeling like he has a tear in his throat. He coughed bright red blood after he had the feeling. His voice sounds hoarse per pt.

## 2021-02-02 IMAGING — CT CT NECK W/ CM
4 series · 15 of 33 positions shown, 18 images · IV contrast (Omnipaque)
Comparison: None.

CLINICAL DATA: Sore throat with difficulty swallowing and
breathing.

EXAM:
CT NECK WITH CONTRAST
TECHNIQUE: Multidetector CT imaging of the neck was performed using the
standard protocol following the bolus administration of intravenous
contrast.
CONTRAST:  75mL OMNIPAQUE IOHEXOL 300 MG/ML  SOLN

[Series 3: axial neck · axial · 0.59mm/px · z∈[-242,-66]mm · 5 of 132 slices shown, 7 images]
[im 22/132  soft-tissue]
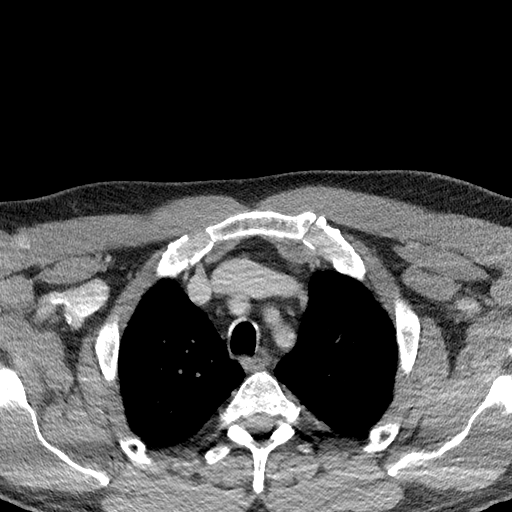
[im 22/132  bone]
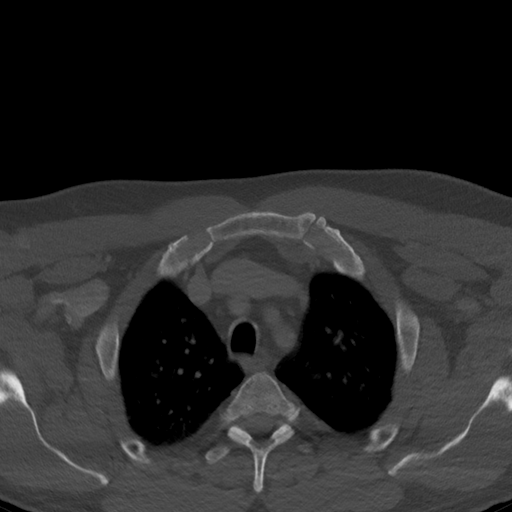
[im 44/132  bone]
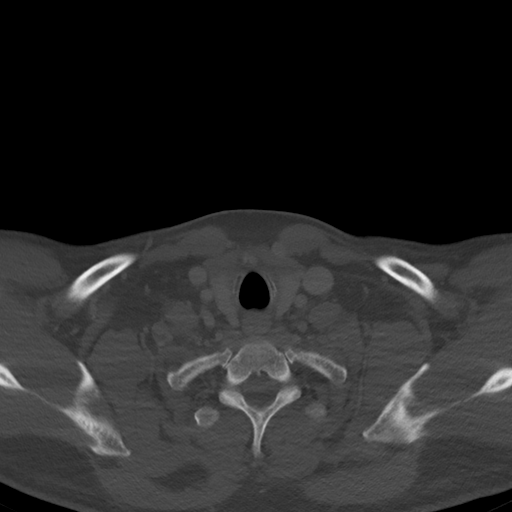
[im 66/132  bone]
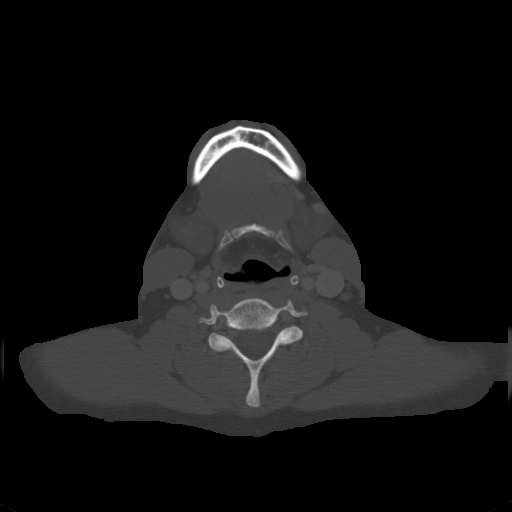
[im 88/132  bone]
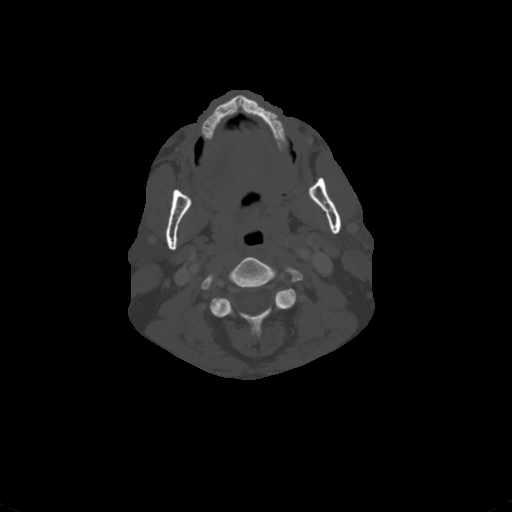
[im 110/132  soft-tissue]
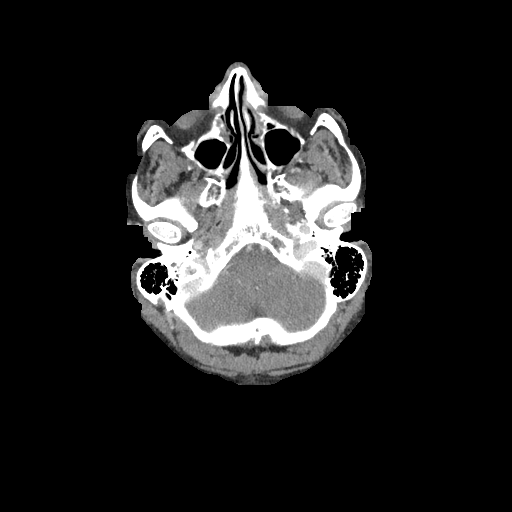
[im 110/132  bone]
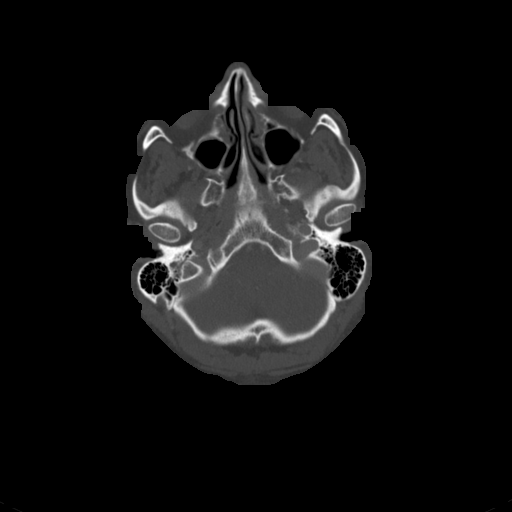

[Series 6: sag neck · sagittal · 0.61mm/px · 5 of 129 slices shown, 6 images]
[im 43/129  bone]
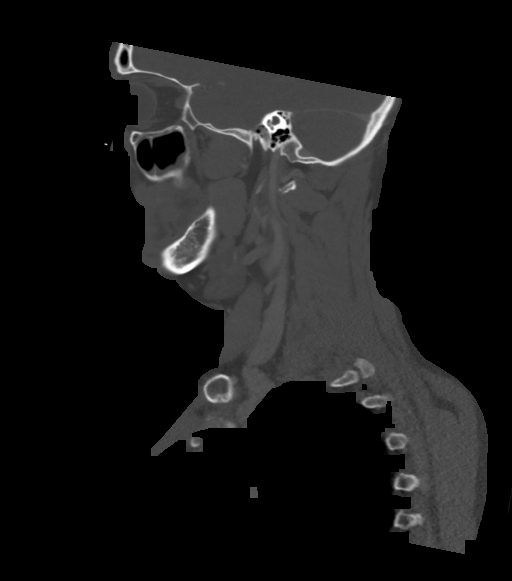
[im 54/129  bone]
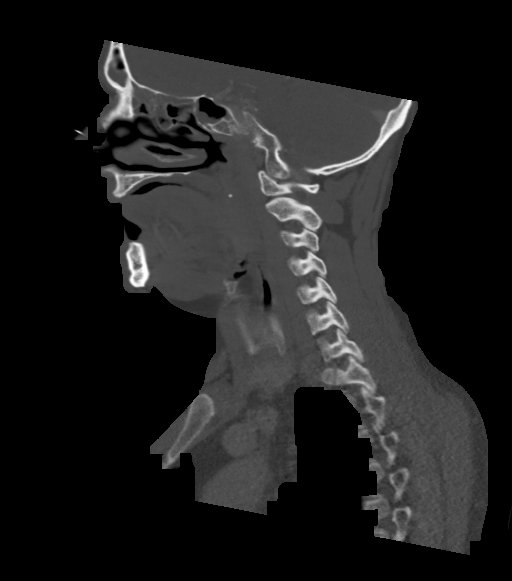
[im 65/129  soft-tissue]
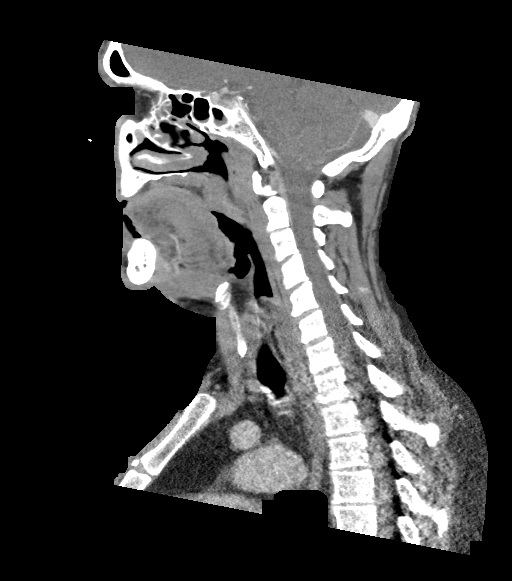
[im 65/129  bone]
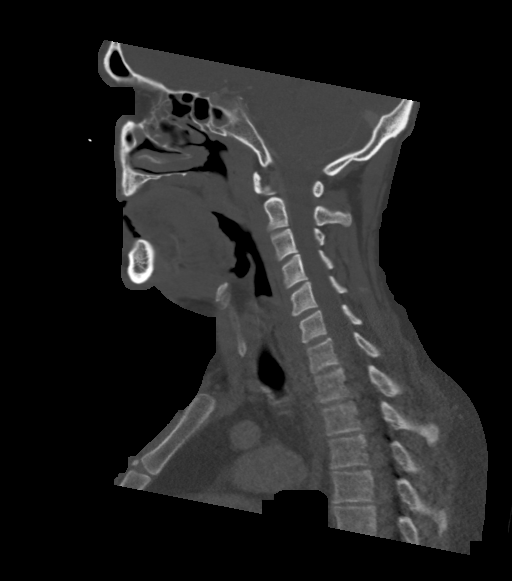
[im 75/129  bone]
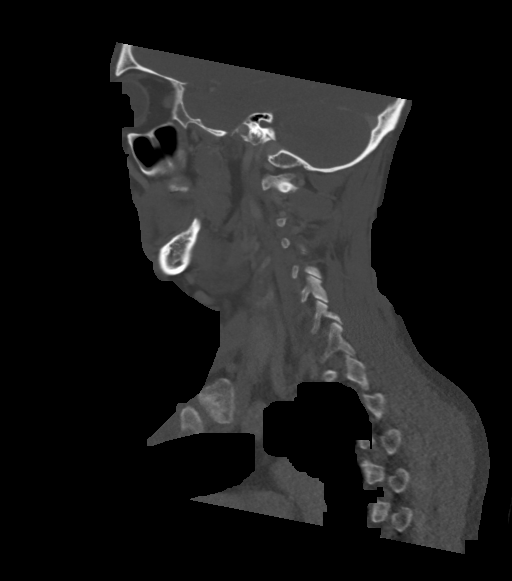
[im 86/129  bone]
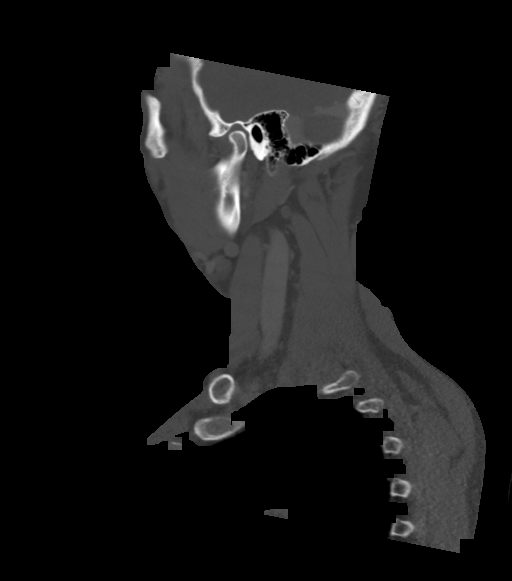

[Series 7: cor neck · coronal · 0.48mm/px · 3 of 108 slices shown]
[im 31/108  bone]
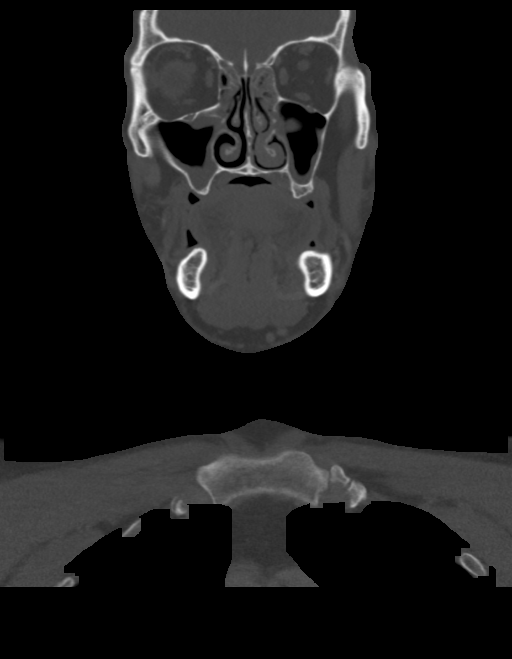
[im 46/108  bone]
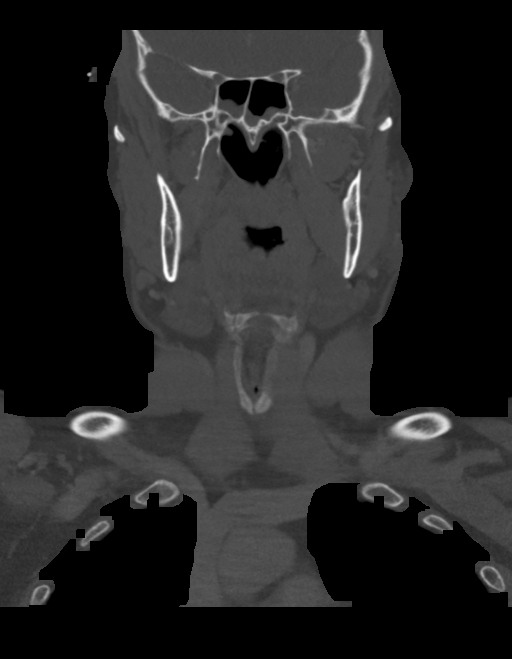
[im 62/108  bone]
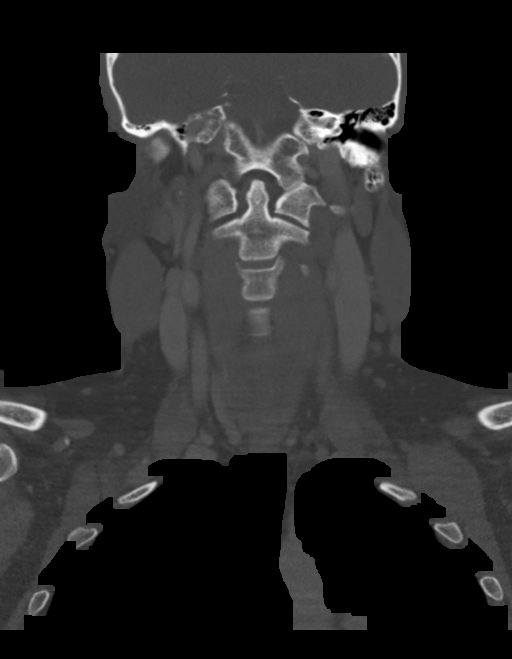

[Series 8: orthogonal ax · axial · 0.49mm/px · z∈[-270,-224]mm · 2 of 143 slices shown]
[im 24/143  bone]
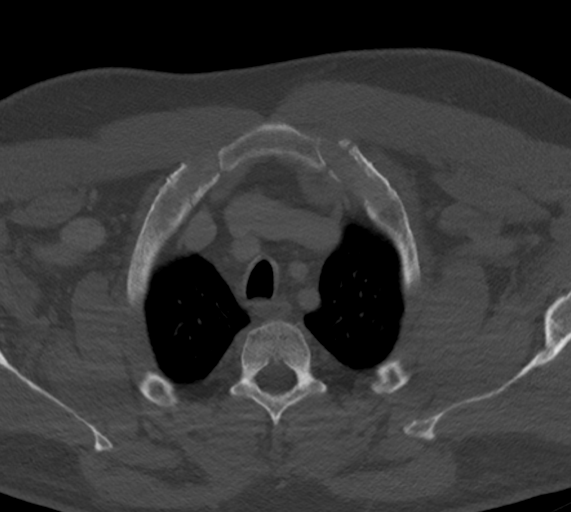
[im 48/143  bone]
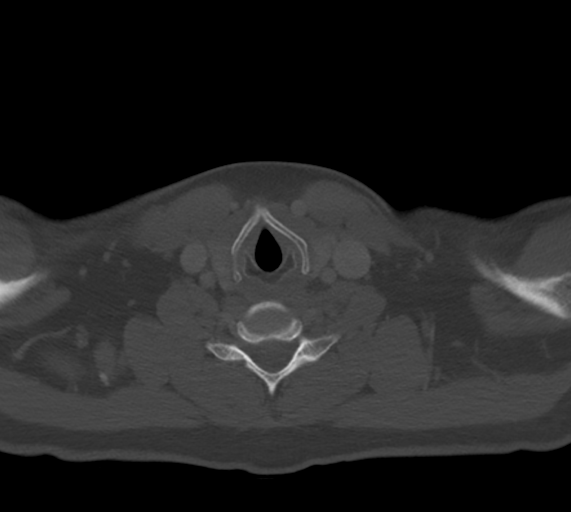

[15 of 33 positions shown; findings below may reference images not displayed]

FINDINGS: Pharynx and larynx: No evidence of mucosal or submucosal mass
lesion. No evidence gross tonsillitis or peritonsillar abscess.
Slight prominence the uvula which could be normal or indicate mild
pharyngitis. No prevertebral/retropharyngeal effusion.

Salivary glands: Parotid and submandibular glands are normal.

Thyroid: Normal

Lymph nodes: No enlarged or low-density nodes on either side of the
neck.

Vascular: Normal

Limited intracranial: Normal

Visualized orbits: Normal

Mastoids and visualized paranasal sinuses: Ordinary mild mucosal
thickening. No advanced sinusitis.

Skeleton: Normal

Upper chest: Normal

Other: None
IMPRESSION: No evidence of tonsillitis or peritonsillar abscess. Slight
prominence of the uvula which could be normal or indicate mild
pharyngitis.

## 2022-09-26 NOTE — Progress Notes (Signed)
 [facility] [place] NEW PATIENT EVALUATION   Referring Physician:  Radiontchenko, Alexei, * Primary Care Physician:  Alexei Radiontchenko, MD   Patient ID:  Marc Liu is a 47 y.o. (DOB 1975/04/20) male.      Subjective   HPI:  Marc Liu is a 47 y.o. male referred for headaches.  Patient presents with 2 headaches every 6 months at this moment.  Was a previous patient of headache clinic.  He is on following medication regimen: Maxalt Topamax  Tegretol   Tolerating medications well no complaints today..   Past Medical History, Past Surgery History, Social History, and Family History were reviewed and updated.    Past Medical History:  Diagnosis Date  . Arthritis   . Deviated septum   . Headache   . Pneumonia    november 2016  . Suicidal thoughts   . Suicide attempt (*)    Self Cutting    Past Surgical History:  Procedure Laterality Date  . Colonoscopy  05/2014  . Elbow surgery Right 08/2015   bone spurs  . Leg wound repair / closure    . Upper gastrointestinal endoscopy  06/05/2018   [facility] [place]  . Vasectomy     Family History  Problem Relation Age of Onset  . Diabetes Mother   . Colon polyps Mother   . Heart disease Father   . No Known Problems Sister   . No Known Problems Brother   . No Known Problems Son   . No Known Problems Brother   . No Known Problems Brother   . No Known Problems Brother   . Migraines Neg Hx   . Seizures Neg Hx   . Stroke Neg Hx   . Colon cancer Neg Hx    Social History   Socioeconomic History  . Marital status: Married  Occupational History  . Occupation: pipe Patent attorney: [employer]  Tobacco Use  . Smoking status: Former    Packs/day: 2.00    Years: 25.00    Additional pack years: 0.00    Total pack years: 50.00    Types: Cigarettes    Quit date: 05/07/2014    Years since quitting: 8.3    Passive exposure: Past  . Smokeless tobacco: Former  . Tobacco  comments:    vapes  Vaping Use  . Vaping Use: Former  Substance and Sexual Activity  . Alcohol use: Yes    Alcohol/week: 6.0 standard drinks of alcohol    Types: 6 Cans of beer per week    Comment: drinks every friday and saturday   . Drug use: No  . Sexual activity: Yes    Partners: Female    Comment: Married      Current Home Medications   Medication Sig  albuterol  sulfate HFA (PROVENTIL ,VENTOLIN ,PROAIR ) 108 (90 Base) MCG/ACT inhaler Inhale two puffs into the lungs every 6 (six) hours as needed for Wheezing or Shortness of Breath.  benzonatate (TESSALON PERLES) 100 mg capsule Take one capsule to two capsules (100-200 mg dose) by mouth 3 (three) times a day as needed for Cough.  carBAMazepine  (TEGRETOL ) 200 mg tablet Take one tablet (200 mg dose) by mouth 3 (three) times a day.  carbamide peroxide (AURO,DEBROX) 6.5% otic solution Place five drops into the right ear 2 (two) times daily.  chlorproMAZINE (THORAZINE) 25 mg tablet Take one tablet (25 mg dose) by mouth 3 (three) times a day as needed (for headache rescue). Not to exceed 5  days use per month  fluticasone propionate (FLONASE) 50 mcg/actuation nasal spray two sprays by Both Nostrils route daily.  levocetirizine (XYZAL) 5 MG tablet Take one tablet (5 mg dose) by mouth every evening.  metoprolol  succinate (TOPROL -XL) 50 mg 24 hr tablet Take one tablet (50 mg dose) by mouth daily.  montelukast (SINGULAIR) 10 MG tablet Take one tablet (10 mg dose) by mouth at bedtime.  omeprazole (PRILOSEC) 40 mg capsule Take one capsule (40 mg dose) by mouth daily.  rizatriptan (MAXALT) 10 MG tablet Take one tablet (10 mg dose) by mouth as needed. May repeat in 2 hours if needed  rosuvastatin  calcium  (CRESTOR ) 40 mg tablet Take 1 tablet by mouth once daily  sildenafil citrate (REVATIO) 20 mg tablet TAKE 1 TABLET BY MOUTH ONCE DAILY AS DIRECTED. APPOINTMENT REQUIRED FOR FUTURE REFILLS  topiramate  (TOPAMAX ) 25 MG tablet TAKE 3 TABLETS BY MOUTH EVERY  DAY    The patient has No Known Allergies.  Review of Systems is complete and negative except as noted in History of Present Illness.  Objective    PHYSICAL EXAM: BP 124/84   Pulse 66   Ht 5' 6 (1.676 m)   Wt 213 lb (96.6 kg)   SpO2 99%   BMI 34.38 kg/m  GENERAL:  No acute distress.  EYES:   Pupils: pupils equally round, reactive to light.  ENT:   Throat: oropharynx clear.  CARDIOVASCULAR:   Palpation/Auscultation: regular rate and rhythm.  RESPIRATORY:   clear to auscultation bilaterally.  GASTROINTESTINAL:   Abdomen: benign, bowel sounds present.  SKIN:   Inspection: well perfused, no edema.   MENTAL STATUS EXAM: Orientation: Alert and oriented to person, place and time. Memory: Cooperative, follows commands well. Recent and remote memory normal.. Attention, concentration: Attention span and concentration are normal. Language: Speech is clear and language is normal. Fund of knowledge: Aware of current events, vocabulary appropriate for patient age.   CRANIAL NERVES: CN 2 (Optic): Visual fields intact to confrontation, funduscopic examination without optic disk pallor or edema, retinal vessels are normal. CN 3,4,6 (EOM): Pupils equal and reactive to light. Full extraocular eye movement without nystagmus. CN 5 (Trigeminal): Facial sensation is normal, no weakness of masticatory muscles. CN 7 (Facial): No facial weakness or asymmetry. CN 8 (Auditory): Auditory acuity grossly normal. CN 9,10 (Glossophar): The uvula is midline, the palate elevates symmetrically. CN 11 (spinal access): Normal sternocleidomastoid and trapezius strength. CN 12 (Hypoglossal): The tongue is midline. No atrophy or fasciculations.   MOTOR: Muscle Strength: Strength - 5/5 and symmetric in the upper and lower extremities, no pronation or drift. Muscle Tone: Tone and muscle bulk are normal in the upper and lower extremities.   REFLEXES:   DTRs - 2+ and symmetrical in all four extremities, plantar  responses are flexor bilaterally.   COORDINATION:   Intact finger-to-nose, heel-to-shin, and rapid alternating movements, no tremor.   SENSATION:  Intact to light touch, vibration, pinprick.  No sensory extinction to DSS. Negative Romberg test.  GAIT: Routine and tandem gait are normal.       Assessment   47 y.o. male with PMHx as above who presents for evaluation of migraines.  No focal findings appreciated today's exam.  Headaches well-controlled on his current regimen.  Will continue his regimen at this time.  Plan  Migraines: - Maxalt 10 mg abortive - Tegretol  200 mg 3 times daily - Topamax  75 mg daily   Risks, benefits, and alternatives of the medications and treatment plan prescribed today were  discussed, and patient expressed understanding and agreement with the plan.  All new prescription medications and changes in current prescription dosages were discussed with the patient, including patient education, medication name, use, dosage, potential side effects, drug interactions, consequences of not using/taking and special instructions. Patient expressed understanding. No barriers to adherence.  Follow up in about 1 year (around 09/26/2023). Documentation for time-based billing:  Total time spent of date of service was 40 minutes.  Patient care activities included preparing to see the patient such as reviewing the patient record, obtaining and/or reviewing separately obtained history, performing a medically appropriate history and physical examination, counseling and educating the patient, family, and/or caregiver, ordering prescription medications, tests, or procedures, documenting clinical information in the electronic or other health record, independently interpreting results when not separately reported, communicating results to the patient/family/caregiver, and coordinating the care of the patient when not separately reported.  *This note was dictated with voice recognition  software. Inadvertently, similar sounding words can, sometimes, get transcribed incorrectly

## 2023-08-28 NOTE — Progress Notes (Signed)
 [facility] [place] NEW PATIENT EVALUATION   Referring Physician:  Radiontchenko, Alexei, * Primary Care Physician:  Alexei Radiontchenko, MD   Patient ID:  Marc Liu is a 48 y.o. (DOB 03/14/1976) male.    Subjective   Marc Liu is a 48 y.o. male referred for F/U. History of Present Illness The patient presents for evaluation of migraines.  He reports a significant improvement in his condition, with no migraine episodes reported in the past 6 months. He has been adhering to his prescribed regimen of Tegretol  and topiramate , which he takes daily, and reports no adverse effects from these medications. Additionally, he has not required any rescue medication during this period.  INTERVAL: Since the last visit, he has experienced no migraine episodes and continues to take Tegretol  and topiramate  daily without any side effects.  MEDICATIONS CURRENT MEDS: Tegretol  Daily Topamax  Daily PREVIOUS MEDS: Maxalt Topiramate     Past Medical History, Past Surgery History, Social History, and Family History were reviewed and updated.    Past Medical History:  Diagnosis Date  . Arthritis   . Deviated septum   . Headache   . Pneumonia    november 2016  . Suicidal thoughts   . Suicide attempt (*)    Self Cutting    Past Surgical History:  Procedure Laterality Date  . Colonoscopy  05/2014  . Elbow surgery Right 08/2015   bone spurs  . Leg wound repair / closure    . Upper gastrointestinal endoscopy  06/05/2018   [facility] [place]  . Vasectomy     Family History  Problem Relation Age of Onset  . Diabetes Mother   . Colon polyps Mother   . Heart disease Father   . No Known Problems Sister   . No Known Problems Brother   . No Known Problems Son   . No Known Problems Brother   . No Known Problems Brother   . No Known Problems Brother   . Migraines Neg Hx   . Seizures Neg Hx   . Stroke Neg Hx   . Colon cancer Neg Hx    Social  History   Socioeconomic History  . Marital status: Married  Occupational History  . Occupation: pipe Patent attorney: [employer]  Tobacco Use  . Smoking status: Former    Current packs/day: 0.00    Average packs/day: 2.0 packs/day for 25.0 years (50.0 ttl pk-yrs)    Types: Cigarettes    Start date: 05/07/1989    Quit date: 05/07/2014    Years since quitting: 9.3    Passive exposure: Past  . Smokeless tobacco: Former  . Tobacco comments:    vapes  Vaping Use  . Vaping status: Former  Substance and Sexual Activity  . Alcohol use: Yes    Alcohol/week: 6.0 standard drinks of alcohol    Types: 6 Cans of beer per week    Comment: drinks every friday and saturday   . Drug use: No  . Sexual activity: Yes    Partners: Female    Comment: Married      Current Home Medications   Medication Sig  albuterol  sulfate HFA (PROVENTIL ,VENTOLIN ,PROAIR ) 108 (90 Base) MCG/ACT inhaler Inhale two puffs into the lungs every 6 (six) hours as needed for Wheezing or Shortness of Breath.  carBAMazepine  (TEGRETOL ) 200 mg tablet Take one tablet (200 mg dose) by mouth 3 (three) times a day.  chlorproMAZINE (THORAZINE) 25 mg tablet Take one tablet (25 mg  dose) by mouth 3 (three) times a day as needed (for headache rescue). Not to exceed 5 days use per month  fluticasone propionate (FLONASE) 50 mcg/actuation nasal spray two sprays by Both Nostrils route daily.  levocetirizine (XYZAL) 5 MG tablet Take one tablet (5 mg dose) by mouth every evening.  metoprolol  succinate (TOPROL -XL) 50 mg 24 hr tablet Take one tablet (50 mg dose) by mouth daily.  metoprolol  succinate (TOPROL -XL) 50 mg 24 hr tablet Take one tablet (50 mg dose) by mouth daily.  montelukast (SINGULAIR) 10 MG tablet Take one tablet (10 mg dose) by mouth at bedtime.  omeprazole (PRILOSEC) 40 mg capsule Take one capsule (40 mg dose) by mouth daily.  rizatriptan (MAXALT) 10 MG tablet Take one tablet (10 mg dose) by mouth as needed. May repeat  in 2 hours if needed  rosuvastatin  calcium  (CRESTOR ) 40 mg tablet Take one tablet (40 mg dose) by mouth daily.  rosuvastatin  calcium  (CRESTOR ) 40 mg tablet Take one tablet (40 mg dose) by mouth daily.  sildenafil citrate (REVATIO) 20 mg tablet TAKE 1 TABLET BY MOUTH ONCE DAILY AS DIRECTED. APPOINTMENT REQUIRED FOR FUTURE REFILLS  topiramate  (TOPAMAX ) 25 MG tablet TAKE 3 TABLETS BY MOUTH EVERY DAY  topiramate  (TOPAMAX ) 25 MG tablet TAKE 3 TABLETS BY MOUTH EVERY DAY  topiramate  (TOPAMAX ) 25 MG tablet TAKE 3 TABLETS BY MOUTH EVERY DAY  traMADol (ULTRAM) 50 mg tablet Take one tablet (50 mg dose) by mouth every 6 (six) hours as needed.    The patient has no known allergies.  Review of Systems is complete and negative except as noted in History of Present Illness.  Objective    PHYSICAL EXAM: BP 126/80 (BP Location: Right Upper Arm, Patient Position: Sitting)   Pulse 72   Ht 5' 6 (1.676 m)   Wt 217 lb (98.4 kg)   SpO2 97%   BMI 35.02 kg/m  GENERAL:  No acute distress.  EYES:   Pupils: pupils equally round, reactive to light.  ENT:   Throat: oropharynx clear.  CARDIOVASCULAR:   Palpation/Auscultation: regular rate and rhythm.  RESPIRATORY:   clear to auscultation bilaterally.  GASTROINTESTINAL:   Abdomen: benign, bowel sounds present.  SKIN:   Inspection: well perfused, no edema.   MENTAL STATUS EXAM: Orientation: Alert and oriented to person, place and time. Memory: Cooperative, follows commands well. Recent and remote memory normal.. Attention, concentration: Attention span and concentration are normal. Language: Speech is clear and language is normal. Fund of knowledge: Aware of current events, vocabulary appropriate for patient age.   CRANIAL NERVES: CN 2 (Optic): Visual fields intact to confrontation, funduscopic examination without optic disk pallor or edema, retinal vessels are normal. CN 3,4,6 (EOM): Pupils equal and reactive to light. Full extraocular eye movement without  nystagmus. CN 5 (Trigeminal): Facial sensation is normal, no weakness of masticatory muscles. CN 7 (Facial): No facial weakness or asymmetry. CN 8 (Auditory): Auditory acuity grossly normal. CN 9,10 (Glossophar): The uvula is midline, the palate elevates symmetrically. CN 11 (spinal access): Normal sternocleidomastoid and trapezius strength. CN 12 (Hypoglossal): The tongue is midline. No atrophy or fasciculations.   MOTOR: Muscle Strength: Strength - 5/5 and symmetric in the upper and lower extremities, no pronation or drift. Muscle Tone: Tone and muscle bulk are normal in the upper and lower extremities.   REFLEXES:   DTRs - 2+ and symmetrical in all four extremities, plantar responses are flexor bilaterally.   COORDINATION:   Intact finger-to-nose, heel-to-shin, and rapid alternating movements, no tremor.  SENSATION:  Intact to light touch, vibration, pinprick.  No sensory extinction to DSS. Negative Romberg test.  GAIT: Routine and tandem gait are normal.    Assessment / Plan   Assessment & Plan 1. Migraines. His migraines are well-managed with the current regimen of Tegretol  and topiramate , with no headaches reported in over 6 months. A blood chemistry panel will be ordered today to ensure the continued safety of his current medication regimen. He is advised to maintain his current treatment plan.  Follow-up The patient will follow up in 8 months.    Risks, benefits, and alternatives of the medications and treatment plan prescribed today were discussed, and patient expressed understanding and agreement with the plan.  All new prescription medications and changes in current prescription dosages were discussed with the patient, including patient education, medication name, use, dosage, potential side effects, drug interactions, consequences of not using/taking and special instructions. Patient expressed understanding. No barriers to adherence.  Follow up in about 8 months  (around 04/29/2024).  Documentation for time-based billing:  Total time spent of date of service was 40 minutes.  Patient care activities included preparing to see the patient such as reviewing the patient record, obtaining and/or reviewing separately obtained history, performing a medically appropriate history and physical examination, counseling and educating the patient, family, and/or caregiver, ordering prescription medications, tests, or procedures, documenting clinical information in the electronic or other health record, independently interpreting results when not separately reported, communicating results to the patient/family/caregiver, and coordinating the care of the patient when not separately reported.  Computer technology was used to create this visit note.  Consent for patient/caregiver was given obtained prior to its use.  *Additional portions of this note was dictated with voice recognition software. Inadvertently, similar sounding words can, sometimes, get transcribed incorrectly

## 2023-10-06 NOTE — Progress Notes (Signed)
 [facility] Video Visit   Patient ID:  Marc Liu is a 48 y.o. 01-01-1976 male. Place of service: patient home Patient has been advised as to the limitations and limited nature of physical exam due to nature of a video visit, the possibility of privacy risk in the use of a video visit, and that the healthcare provider may recommend visiting a healthcare clinic for in-person care and follow up.  Video Visit Assessment and Plan   1. Herpes zoster without complication (Primary) Other orders -     valacyclovir (VALTREX) 1000 mg tablet; Take one tablet (1,000 mg dose) by mouth 3 (three) times a day., Starting Sun 10/06/2023, Normal -     lidocaine  (XYLOCAINE ) 5 % ointment; Apply topically every 4 (four) hours as needed., Starting Sun 10/06/2023, Normal    Medication as directed OTC medication prn Home care as discussed  Patient's Medications    Patient's Medications       * Accurate as of October 06, 2023  7:13 PM. Reflects encounter med changes as of last refresh          New Prescriptions      Instructions  lidocaine  5 % ointment Commonly known as: XYLOCAINE  Started by: Zada Peasant  Topical, Every 4 hours as needed   valacyclovir 1000 mg tablet Commonly known as: VALTREX Started by: Zada Justice  1,000 mg, Oral, 3 times a day       Continued Medications      Instructions  albuterol  sulfate HFA 108 (90 Base) MCG/ACT inhaler Commonly known as: PROVENTIL ,VENTOLIN ,PROAIR   2 puffs, Inhalation, Every 6 hours as needed   carBAMazepine  200 mg tablet Commonly known as: TEGRETOL   200 mg, Oral, 3 times daily   chlorproMAZINE 25 mg tablet Commonly known as: THORAZINE  25 mg, Oral, 3 times a day as needed, Not to exceed 5 days use per month   fluticasone propionate 50 mcg/actuation nasal spray Commonly known as: FLONASE  2 sprays, Both Nostrils, Daily   metoprolol  succinate 50 mg 24 hr tablet Commonly known as: TOPROL -XL  50 mg, Oral, Daily    omeprazole 40 mg capsule Commonly known as: PRILOSEC  40 mg, Oral, Daily   rizatriptan 10 MG tablet Commonly known as: MAXALT  10 mg, Oral, As needed, May repeat in 2 hours if needed   rosuvastatin  calcium  40 mg tablet Commonly known as: CRESTOR   40 mg, Oral, Daily   sildenafil citrate 20 mg tablet Commonly known as: REVATIO  TAKE 1 TABLET BY MOUTH ONCE DAILY AS DIRECTED. APPOINTMENT REQUIRED FOR FUTURE REFILLS   topiramate  25 MG tablet Commonly known as: TOPAMAX   TAKE 3 TABLETS BY MOUTH EVERY DAY   traMADol 50 mg tablet Commonly known as: ULTRAM  50 mg, Every 6 hours as needed       Discontinued Medications    levocetirizine 5 mg tablet Commonly known as: XYZAL Stopped by: Joy Justice   montelukast 10 MG tablet Commonly known as: SINGULAIR Stopped by: Joy Justice           Risk, benefits, and alternatives were provided through patient instructions given to the patient electronically and during the video interaction.  If any worsening symptoms or lack of improvement, the patient will seek immediate medical care.  Video Visit History   HPI Chief Complaint  Patient presents with  . Rash    Rash on chest, upper back X 3 days. Painful, itching, burning. Feels hot and warm at time. OTC hydrocortisone. Admits to chicken  pox as discussed.     Noted pain prior to rash onset.  No shingles vaccine    Reviewed and updated this visit by provider: Tobacco  Allergies  Meds  Problems  Med Hx  Surg Hx  Fam Hx        ROS: As documented in the history above, all other relevant system complaints were negative.  Video Visit Objective Findings  Examination conducted with the use of video cameras/computer monitors. Vital signs and other aspects of physical exam are limited due to the nature of this encounter.   Constitutional: No apparent acute distress noted during the video interaction; Alert and oriented with normal mentation and verbally interactive. Mood:  Appears appropriate to situation. Skin: Left flank with zoster eruption *Some images could not be shown.

## 2023-11-21 NOTE — Progress Notes (Signed)
 [facility] Video Visit   Patient ID:  Marc Liu is a 48 y.o. 1976/03/19 male. Place of service: patient home Patient has been advised as to the limitations and limited nature of physical exam due to nature of a video visit, the possibility of privacy risk in the use of a video visit, and that the healthcare provider may recommend visiting a healthcare clinic for in-person care and follow up.  Video Visit Assessment and Plan   1. Viral syndrome (Primary) Other orders -     fluticasone propionate (FLONASE) 50 mcg/actuation nasal spray; one spray by Nasal route 2 (two) times daily., Starting Thu 11/21/2023, Normal -     azelastine (ASTELIN) 0.1 % nasal spray; one spray by Both Nostrils route 2 (two) times daily. Use in each nostril as directed, Starting Thu 11/21/2023, Until Fri 11/20/2024, Normal    Medication as directed OTC medication as directed Home care as discussed PCP f/u prn  Patient's Medications    Patient's Medications       * Accurate as of November 21, 2023  8:36 AM. Reflects encounter med changes as of last refresh          New Prescriptions      Instructions  azelastine 0.1 % nasal spray Commonly known as: ASTELIN Started by: Joy Justice  1 spray, Both Nostrils, 2 times a day, Use in each nostril as directed       Continued Medications      Instructions  albuterol  sulfate HFA 108 (90 Base) MCG/ACT inhaler Commonly known as: PROVENTIL ,VENTOLIN ,PROAIR   2 puffs, Inhalation, Every 6 hours as needed   carBAMazepine  200 mg tablet Commonly known as: TEGRETOL   200 mg, Oral, 3 times daily   chlorproMAZINE 25 mg tablet Commonly known as: THORAZINE  25 mg, Oral, 3 times a day as needed, Not to exceed 5 days use per month   gabapentin 100 mg capsule Commonly known as: NEURONTIN  100 mg, Oral, 3 times a day as needed   guaiFENesin -Codeine 100-10 mg/5 mL solution Commonly known as: ROBITUSSIN-AC  5 mLs, Oral, 2 times a day as needed    metoprolol  succinate 50 mg 24 hr tablet Commonly known as: TOPROL -XL  50 mg, Oral, Daily   omeprazole 40 mg capsule Commonly known as: PRILOSEC  40 mg, Oral, Daily   rizatriptan 10 MG tablet Commonly known as: MAXALT  10 mg, Oral, As needed, May repeat in 2 hours if needed   rosuvastatin  calcium  40 mg tablet Commonly known as: CRESTOR   40 mg, Oral, Daily   sildenafil citrate 20 mg tablet Commonly known as: REVATIO  TAKE 1 TABLET BY MOUTH ONCE DAILY AS DIRECTED. APPOINTMENT REQUIRED FOR FUTURE REFILLS   topiramate  25 MG tablet Commonly known as: TOPAMAX   TAKE 3 TABLETS BY MOUTH EVERY DAY   traMADol 50 mg tablet Commonly known as: ULTRAM  50 mg, Every 6 hours as needed   valacyclovir 1000 mg tablet Commonly known as: VALTREX  1,000 mg, Oral, 3 times a day       Modified Medications      Instructions  fluticasone propionate 50 mcg/actuation nasal spray Commonly known as: FLONASE What changed:  how much to take how to take this when to take this Changed by: Joy Justice  1 spray, Nasal, 2 times a day           Risk, benefits, and alternatives were provided through patient instructions given to the patient electronically and during the video interaction.  If  any worsening symptoms or lack of improvement, the patient will seek immediate medical care.  Video Visit History   HPI Chief Complaint  Patient presents with  . Sinus Problem    Fever, body aches, runny nose, loose stool started today. Denies sore throat, cough. Had a viral upper respiratory last week. COVID negative today. Not COVID vaccinated. OTC none.  Wife ill also.    No wheezing or SOB.     Reviewed and updated this visit by provider: Tobacco  Allergies  Meds  Problems  Med Hx  Surg Hx  Fam Hx        ROS: As documented in the history above, all other relevant system complaints were negative.  Video Visit Objective Findings  Examination conducted with the use of video  cameras/computer monitors. Vital signs and other aspects of physical exam are limited due to the nature of this encounter.   Constitutional:Ill appearance noted during the video interaction; Alert and oriented with normal mentation and verbally interactive. Mood: Appears appropriate to situation.     *Some images could not be shown.

## 2023-12-11 ENCOUNTER — Emergency Department (HOSPITAL_COMMUNITY): Admitting: Anesthesiology

## 2023-12-11 ENCOUNTER — Encounter (HOSPITAL_COMMUNITY)
Admission: EM | Disposition: A | Discharge status: Home Health | Payer: Self-pay | Source: Ambulatory Visit | Attending: Internal Medicine

## 2023-12-11 ENCOUNTER — Inpatient Hospital Stay (HOSPITAL_COMMUNITY)
Admission: EM | Admit: 2023-12-11 | Discharge: 2023-12-16 | DRG: 857 | Disposition: A | Discharge status: Home Health | Source: Ambulatory Visit | Attending: Internal Medicine | Admitting: Internal Medicine

## 2023-12-11 ENCOUNTER — Emergency Department (HOSPITAL_COMMUNITY)

## 2023-12-11 ENCOUNTER — Encounter (HOSPITAL_COMMUNITY): Payer: Self-pay

## 2023-12-11 DIAGNOSIS — Z79899 Other long term (current) drug therapy: Secondary | ICD-10-CM | POA: Diagnosis not present

## 2023-12-11 DIAGNOSIS — M6588 Other synovitis and tenosynovitis, other site: Secondary | ICD-10-CM | POA: Diagnosis present

## 2023-12-11 DIAGNOSIS — T8029XA Infection following other infusion, transfusion and therapeutic injection, initial encounter: Secondary | ICD-10-CM | POA: Diagnosis present

## 2023-12-11 DIAGNOSIS — I10 Essential (primary) hypertension: Secondary | ICD-10-CM | POA: Diagnosis present

## 2023-12-11 DIAGNOSIS — M00861 Arthritis due to other bacteria, right knee: Secondary | ICD-10-CM | POA: Diagnosis not present

## 2023-12-11 DIAGNOSIS — G43909 Migraine, unspecified, not intractable, without status migrainosus: Secondary | ICD-10-CM | POA: Insufficient documentation

## 2023-12-11 DIAGNOSIS — Z7982 Long term (current) use of aspirin: Secondary | ICD-10-CM

## 2023-12-11 DIAGNOSIS — M25461 Effusion, right knee: Secondary | ICD-10-CM | POA: Diagnosis present

## 2023-12-11 DIAGNOSIS — M00061 Staphylococcal arthritis, right knee: Secondary | ICD-10-CM | POA: Diagnosis present

## 2023-12-11 DIAGNOSIS — Z6836 Body mass index (BMI) 36.0-36.9, adult: Secondary | ICD-10-CM | POA: Diagnosis not present

## 2023-12-11 DIAGNOSIS — R509 Fever, unspecified: Secondary | ICD-10-CM | POA: Diagnosis not present

## 2023-12-11 DIAGNOSIS — B9561 Methicillin susceptible Staphylococcus aureus infection as the cause of diseases classified elsewhere: Secondary | ICD-10-CM | POA: Diagnosis present

## 2023-12-11 DIAGNOSIS — E669 Obesity, unspecified: Secondary | ICD-10-CM | POA: Diagnosis present

## 2023-12-11 DIAGNOSIS — M009 Pyogenic arthritis, unspecified: Secondary | ICD-10-CM | POA: Diagnosis not present

## 2023-12-11 DIAGNOSIS — F319 Bipolar disorder, unspecified: Secondary | ICD-10-CM | POA: Diagnosis present

## 2023-12-11 DIAGNOSIS — Z87891 Personal history of nicotine dependence: Secondary | ICD-10-CM | POA: Diagnosis not present

## 2023-12-11 DIAGNOSIS — E785 Hyperlipidemia, unspecified: Secondary | ICD-10-CM | POA: Insufficient documentation

## 2023-12-11 HISTORY — PX: IRRIGATION AND DEBRIDEMENT KNEE: SHX5185

## 2023-12-11 LAB — CBC WITH DIFFERENTIAL/PLATELET
Abs Immature Granulocytes: 0.03 K/uL (ref 0.00–0.07)
Basophils Absolute: 0 K/uL (ref 0.0–0.1)
Basophils Relative: 1 %
Eosinophils Absolute: 0.2 K/uL (ref 0.0–0.5)
Eosinophils Relative: 3 %
HCT: 41.6 % (ref 39.0–52.0)
Hemoglobin: 13.2 g/dL (ref 13.0–17.0)
Immature Granulocytes: 1 %
Lymphocytes Relative: 26 %
Lymphs Abs: 1.7 K/uL (ref 0.7–4.0)
MCH: 28.8 pg (ref 26.0–34.0)
MCHC: 31.7 g/dL (ref 30.0–36.0)
MCV: 90.6 fL (ref 80.0–100.0)
Monocytes Absolute: 0.7 K/uL (ref 0.1–1.0)
Monocytes Relative: 10 %
Neutro Abs: 3.9 K/uL (ref 1.7–7.7)
Neutrophils Relative %: 59 %
Platelets: 166 K/uL (ref 150–400)
RBC: 4.59 MIL/uL (ref 4.22–5.81)
RDW: 12.9 % (ref 11.5–15.5)
WBC: 6.5 K/uL (ref 4.0–10.5)
nRBC: 0 % (ref 0.0–0.2)

## 2023-12-11 LAB — BASIC METABOLIC PANEL WITH GFR
Anion gap: 13 (ref 5–15)
BUN: 13 mg/dL (ref 6–20)
CO2: 23 mmol/L (ref 22–32)
Calcium: 9.2 mg/dL (ref 8.9–10.3)
Chloride: 102 mmol/L (ref 98–111)
Creatinine, Ser: 0.73 mg/dL (ref 0.61–1.24)
GFR, Estimated: >60 mL/min (ref >60)
Glucose, Bld: 105 mg/dL — ABNORMAL HIGH (ref 70–99)
Potassium: 3.9 mmol/L (ref 3.5–5.1)
Sodium: 139 mmol/L (ref 135–145)

## 2023-12-11 LAB — C-REACTIVE PROTEIN: CRP: 4.6 mg/dL — ABNORMAL HIGH (ref <1.0)

## 2023-12-11 LAB — LACTIC ACID, PLASMA: Lactic Acid, Venous: 1.3 mmol/L (ref 0.5–1.9)

## 2023-12-11 LAB — SEDIMENTATION RATE: Sed Rate: 35 mm/h — ABNORMAL HIGH (ref 0–16)

## 2023-12-11 SURGERY — IRRIGATION AND DEBRIDEMENT KNEE
Anesthesia: General | Site: Knee | Laterality: Right

## 2023-12-11 MED ORDER — MIDAZOLAM HCL 2 MG/2ML IJ SOLN
INTRAMUSCULAR | Status: DC | PRN
  Administered 2023-12-11: 2 mg via INTRAVENOUS

## 2023-12-11 MED ORDER — LIDOCAINE HCL (PF) 2 % IJ SOLN
INTRAMUSCULAR | Status: DC | PRN
  Administered 2023-12-11: 80 mg via INTRADERMAL

## 2023-12-11 MED ORDER — FENTANYL CITRATE (PF) 100 MCG/2ML IJ SOLN
INTRAMUSCULAR | Status: AC
  Filled 2023-12-11: qty 2

## 2023-12-11 MED ORDER — VANCOMYCIN HCL 1500 MG/300ML IV SOLN
1500.0000 mg | INTRAVENOUS | Status: AC
  Administered 2023-12-11: 1500 mg via INTRAVENOUS
  Filled 2023-12-11: qty 300

## 2023-12-11 MED ORDER — HYDROMORPHONE HCL 1 MG/ML IJ SOLN
INTRAMUSCULAR | Status: DC | PRN
  Administered 2023-12-11: 1 mg via INTRAVENOUS

## 2023-12-11 MED ORDER — LABETALOL HCL 5 MG/ML IV SOLN
INTRAVENOUS | Status: AC
  Filled 2023-12-11: qty 4

## 2023-12-11 MED ORDER — ONDANSETRON HCL 4 MG/2ML IJ SOLN: 4.0000 mg | Freq: Once | INTRAMUSCULAR | Status: DC | PRN

## 2023-12-11 MED ORDER — LACTATED RINGERS IV SOLN: INTRAVENOUS | Status: DC | PRN

## 2023-12-11 MED ORDER — ACETAMINOPHEN 10 MG/ML IV SOLN
INTRAVENOUS | Status: AC
  Filled 2023-12-11: qty 100

## 2023-12-11 MED ORDER — FENTANYL CITRATE (PF) 250 MCG/5ML IJ SOLN
INTRAMUSCULAR | Status: DC | PRN
  Administered 2023-12-11 (×5): 25 ug via INTRAVENOUS
  Administered 2023-12-11: 50 ug via INTRAVENOUS

## 2023-12-11 MED ORDER — DROPERIDOL 2.5 MG/ML IJ SOLN: 0.6250 mg | Freq: Once | INTRAMUSCULAR | Status: DC | PRN

## 2023-12-11 MED ORDER — SURGIPHOR WOUND IRRIGATION SYSTEM - OPTIME: TOPICAL | Status: DC | PRN

## 2023-12-11 MED ORDER — LIDOCAINE 2% (20 MG/ML) 5 ML SYRINGE: INTRAMUSCULAR | Status: DC | PRN

## 2023-12-11 MED ORDER — ACETAMINOPHEN 10 MG/ML IV SOLN
INTRAVENOUS | Status: DC | PRN
  Administered 2023-12-11: 1000 mg via INTRAVENOUS

## 2023-12-11 MED ORDER — PROPOFOL 10 MG/ML IV BOLUS
INTRAVENOUS | Status: AC
  Filled 2023-12-11: qty 20

## 2023-12-11 MED ORDER — HYDROMORPHONE HCL 1 MG/ML IJ SOLN
0.2500 mg | INTRAMUSCULAR | Status: DC | PRN
  Administered 2023-12-12 (×2): 0.5 mg via INTRAVENOUS

## 2023-12-11 MED ORDER — SODIUM CHLORIDE 0.9 % IR SOLN
Status: DC | PRN
  Administered 2023-12-11 (×2): 3000 mL

## 2023-12-11 MED ORDER — DEXAMETHASONE SODIUM PHOSPHATE 10 MG/ML IJ SOLN
INTRAMUSCULAR | Status: DC | PRN
  Administered 2023-12-11: 10 mg via INTRAVENOUS

## 2023-12-11 MED ORDER — MIDAZOLAM HCL 2 MG/2ML IJ SOLN
INTRAMUSCULAR | Status: AC
  Filled 2023-12-11: qty 2

## 2023-12-11 MED ORDER — KETAMINE HCL 50 MG/5ML IJ SOSY
PREFILLED_SYRINGE | INTRAMUSCULAR | Status: AC
  Filled 2023-12-11: qty 5

## 2023-12-11 MED ORDER — HYDROMORPHONE HCL 2 MG/ML IJ SOLN
INTRAMUSCULAR | Status: AC
  Filled 2023-12-11: qty 1

## 2023-12-11 MED ORDER — OXYCODONE HCL 5 MG PO TABS: 5.0000 mg | ORAL_TABLET | Freq: Once | ORAL | Status: DC | PRN

## 2023-12-11 MED ORDER — VANCOMYCIN HCL 1000 MG IV SOLR
INTRAVENOUS | Status: DC | PRN
  Administered 2023-12-11: 1000 mg via TOPICAL

## 2023-12-11 MED ORDER — OXYCODONE HCL 5 MG/5ML PO SOLN: 5.0000 mg | Freq: Once | ORAL | Status: DC | PRN

## 2023-12-11 MED ORDER — CHLORHEXIDINE GLUCONATE 4 % EX SOLN: 60.0000 mL | Freq: Once | CUTANEOUS | Status: DC

## 2023-12-11 MED ORDER — 0.9 % SODIUM CHLORIDE (POUR BTL) OPTIME
TOPICAL | Status: DC | PRN
  Administered 2023-12-11: 1000 mL

## 2023-12-11 MED ORDER — CEFAZOLIN SODIUM-DEXTROSE 2-4 GM/100ML-% IV SOLN
2.0000 g | INTRAVENOUS | Status: AC
  Administered 2023-12-11: 2 g via INTRAVENOUS

## 2023-12-11 MED ORDER — PROPOFOL 10 MG/ML IV BOLUS
INTRAVENOUS | Status: AC
Start: 2023-12-11 — End: 2023-12-11
  Filled 2023-12-11: qty 20

## 2023-12-11 MED ORDER — CEFAZOLIN SODIUM-DEXTROSE 2-4 GM/100ML-% IV SOLN
INTRAVENOUS | Status: AC
  Filled 2023-12-11: qty 100

## 2023-12-11 MED ORDER — MORPHINE SULFATE (PF) 4 MG/ML IV SOLN
4.0000 mg | Freq: Once | INTRAVENOUS | Status: AC
Start: 2023-12-11 — End: 2023-12-11
  Administered 2023-12-11: 4 mg via INTRAVENOUS
  Filled 2023-12-11: qty 1

## 2023-12-11 MED ORDER — SUCCINYLCHOLINE CHLORIDE 200 MG/10ML IV SOSY
PREFILLED_SYRINGE | INTRAVENOUS | Status: DC | PRN
  Administered 2023-12-11: 60 mg via INTRAVENOUS

## 2023-12-11 MED ORDER — LABETALOL HCL 5 MG/ML IV SOLN
5.0000 mg | INTRAVENOUS | Status: AC | PRN
  Administered 2023-12-11 – 2023-12-12 (×3): 5 mg via INTRAVENOUS

## 2023-12-11 MED ORDER — OXYCODONE HCL 5 MG PO TABS: 5.0000 mg | ORAL_TABLET | ORAL | 0 refills | Status: DC | PRN

## 2023-12-11 MED ORDER — PROPOFOL 10 MG/ML IV BOLUS
INTRAVENOUS | Status: DC | PRN
  Administered 2023-12-11: 50 mg via INTRAVENOUS
  Administered 2023-12-11: 180 mg via INTRAVENOUS

## 2023-12-11 MED ORDER — VANCOMYCIN HCL 1000 MG IV SOLR
INTRAVENOUS | Status: AC
  Filled 2023-12-11: qty 20

## 2023-12-11 MED ORDER — LIDOCAINE HCL (PF) 1 % IJ SOLN: 10.0000 mL | Freq: Once | INTRAMUSCULAR | Status: DC

## 2023-12-11 MED ORDER — OXYCODONE HCL 5 MG PO TABS: 5.0000 mg | ORAL_TABLET | ORAL | 0 refills | Status: AC | PRN

## 2023-12-11 MED ORDER — KETAMINE HCL 50 MG/5ML IJ SOSY
PREFILLED_SYRINGE | INTRAMUSCULAR | Status: DC | PRN
  Administered 2023-12-11: 20 mg via INTRAVENOUS

## 2023-12-11 MED ORDER — ONDANSETRON HCL 4 MG/2ML IJ SOLN
INTRAMUSCULAR | Status: DC | PRN
  Administered 2023-12-11: 4 mg via INTRAVENOUS

## 2023-12-11 MED ORDER — VANCOMYCIN HCL 1500 MG/300ML IV SOLN
1500.0000 mg | INTRAVENOUS | Status: DC
  Filled 2023-12-11: qty 300

## 2023-12-11 SURGICAL SUPPLY — 48 items
BAG COUNTER SPONGE SURGICOUNT (BAG) ×1 IMPLANT
BAG ZIPLOCK 12X15 (MISCELLANEOUS) ×1 IMPLANT
BLADE SAW SGTL 11.0X1.19X90.0M (BLADE) IMPLANT
BLADE SURG 15 STRL LF DISP TIS (BLADE) ×2 IMPLANT
BNDG ELASTIC 6INX 5YD STR LF (GAUZE/BANDAGES/DRESSINGS) ×1 IMPLANT
BNDG ELASTIC 6X10 VLCR STRL LF (GAUZE/BANDAGES/DRESSINGS) IMPLANT
BNDG GAUZE DERMACEA FLUFF 4 (GAUZE/BANDAGES/DRESSINGS) ×1 IMPLANT
CHLORAPREP W/TINT 26 (MISCELLANEOUS) ×2 IMPLANT
CNTNR URN SCR LID CUP LEK RST (MISCELLANEOUS) ×3 IMPLANT
COVER SURGICAL LIGHT HANDLE (MISCELLANEOUS) ×1 IMPLANT
CUFF TRNQT CYL 34X4.125X (TOURNIQUET CUFF) ×1 IMPLANT
DERMABOND ADVANCED .7 DNX12 (GAUZE/BANDAGES/DRESSINGS) ×1 IMPLANT
DRAPE INCISE IOBAN 66X45 STRL (DRAPES) ×3 IMPLANT
DRAPE SHEET LG 3/4 BI-LAMINATE (DRAPES) ×1 IMPLANT
DRESSING AQUACEL AG SP 3.5X10 (GAUZE/BANDAGES/DRESSINGS) IMPLANT
DRSG ADAPTIC 3X8 NADH LF (GAUZE/BANDAGES/DRESSINGS) IMPLANT
DRSG AQUACEL AG ADV 3.5X10 (GAUZE/BANDAGES/DRESSINGS) ×1 IMPLANT
DRSG TEGADERM 4X4.75 (GAUZE/BANDAGES/DRESSINGS) IMPLANT
EVACUATOR 1/8 PVC DRAIN (DRAIN) IMPLANT
GAUZE PAD ABD 8X10 STRL (GAUZE/BANDAGES/DRESSINGS) IMPLANT
GAUZE SPONGE 2X2 8PLY STRL LF (GAUZE/BANDAGES/DRESSINGS) IMPLANT
GAUZE SPONGE 4X4 12PLY STRL (GAUZE/BANDAGES/DRESSINGS) IMPLANT
GLOVE BIO SURGEON STRL SZ7.5 (GLOVE) ×1 IMPLANT
GLOVE BIOGEL PI IND STRL 7.5 (GLOVE) ×1 IMPLANT
GOWN STRL REUS W/ TWL LRG LVL3 (GOWN DISPOSABLE) ×1 IMPLANT
MANIFOLD NEPTUNE II (INSTRUMENTS) ×1 IMPLANT
PACK ORTHO EXTREMITY (CUSTOM PROCEDURE TRAY) ×1 IMPLANT
PACK TOTAL KNEE CUSTOM (KITS) ×1 IMPLANT
PROTECTOR NERVE ULNAR (MISCELLANEOUS) ×1 IMPLANT
SET CYSTO W/LG BORE CLAMP LF (SET/KITS/TRAYS/PACK) ×1 IMPLANT
SET HNDPC FAN SPRY TIP SCT (DISPOSABLE) ×1 IMPLANT
SOLUTION IRRIG SURGIPHOR (IV SOLUTION) ×1 IMPLANT
SOLUTION PRONTOSAN WOUND 350ML (IRRIGATION / IRRIGATOR) ×1 IMPLANT
SPONGE T-LAP 4X18 ~~LOC~~+RFID (SPONGE) ×1 IMPLANT
STAPLER SKIN PROX 35W (STAPLE) IMPLANT
SUT ETHILON 2 0 FS 18 (SUTURE) IMPLANT
SUT ETHILON 2 0 PSLX (SUTURE) IMPLANT
SUT MNCRL AB 4-0 PS2 18 (SUTURE) ×1 IMPLANT
SUT PDS AB 0 CT1 36 (SUTURE) IMPLANT
SUT STRATAFIX PDS+ 0 24IN (SUTURE) ×1 IMPLANT
SUT VIC AB 0 CT1 36 (SUTURE) ×1 IMPLANT
SUT VIC AB 1 CT1 36 (SUTURE) ×2 IMPLANT
SUT VIC AB 2-0 CT1 TAPERPNT 27 (SUTURE) ×3 IMPLANT
SWAB COLLECTION DEVICE MRSA (MISCELLANEOUS) IMPLANT
SWAB CULTURE ESWAB REG 1ML (MISCELLANEOUS) IMPLANT
TRAY PREMIUM WET SKIN SCRUB (MISCELLANEOUS) ×1 IMPLANT
TUBING CONNECTING 10 (TUBING) ×1 IMPLANT
YANKAUER SUCT BULB TIP NO VENT (SUCTIONS) ×1 IMPLANT

## 2023-12-11 NOTE — Care Plan (Addendum)
 Orthopaedic Surgery Plan of Care Note   -history and imaging reviewed with requesting team (ER) -pt has concerning findings for right knee septic arthritis. Pt has had acute on chronic pain x 5 days with known prior meniscus tearing (MRI May 2025) with new onset worsening associated with steroid injection. He had copious cloudy aspirate in office yesterday with fluid analysis results pending -admit to hospitalist team -plan for emergent right knee I&D given concerning clinical findings and history consistent with acute septic arthritis of right knee. Discussed with anesthesia on call and OR control desk that if not surgery tonight, there may well be delay into tomorrow evening. We will proceed with surgery now based on available clinical info to achieve potential source control especially in absence of any other sources and presence of ongoing systemic symptoms of fever/chills. -please keep NPO and hold VTE ppx. Pt NPO since 11 AM today -preop surgery orders placed -full consult note to follow   Lillia Mountain, MD Orthopaedic Surgery [facility]

## 2023-12-11 NOTE — H&P (Signed)
 History and Physical    Marc Liu FMW:[id number] DOB: 03/23/1976 DOA: 12/11/2023  Patient coming from: Home.  Chief Complaint: Right knee swelling and pain.  HPI: Marc Liu is a 48 y.o. male with history of hypertension, hyperlipidemia, migraines presents to the ER because of worsening swelling of the right knee and pain.  Patient states about a week ago patient had swelling of his right knee.  Denies any trauma or insect bites.  Had gone to orthopedic office where patient had steroid joint injection.  Following which patient's pain actually worsened and had presented to the orthopedic office yesterday when patient had arthrocentesis done labs were still pending was prescribed antibiotics.  Patient presents to the ER because of worsening pain and swelling.  Patient also had subjective feeling of fever chills at home.  ED Course: In the ER x-ray showed right knee effusion.  Temperature was 98 F.  Labs showed WBC of 6.5 creatinine 0.7.  CRP 4.6.  Orthopedic surgeon Dr. Barton was consulted.  Dr. Barton took patient to the OR since patient's arthrocentesis fluid yesterday was cloudy and there was concern for septic arthritis.  After the OR I discussed with Dr. Barton and feels that patient's knee joint effusion is very concerning for septic arthritis.  Review of Systems: As per HPI, rest all negative.   Past Medical History:  Diagnosis Date   Bipolar disorder (HCC)    Depression    Migraines    Shortness of breath dyspnea    Sinusitis     Past Surgical History:  Procedure Laterality Date   right lower leg tendons       reports that he quit smoking about 9 years ago. He started smoking about 34 years ago. He has a 12.5 pack-year smoking history. He has never used smokeless tobacco. He reports current alcohol use. He reports that he does not use drugs.  No Known Allergies  Family History  Problem Relation Age of Onset   Cancer Mother    Cancer  Father     Prior to Admission medications   Medication Sig Start Date End Date Taking? Authorizing Provider  albuterol  (PROVENTIL ) (2.5 MG/3ML) 0.083% nebulizer solution Take 3 mLs (2.5 mg total) by nebulization every 4 (four) hours as needed for wheezing or shortness of breath. 02/16/15 01/23/19  Billy Junnie HERO, MD  atenolol (TENORMIN) 25 MG tablet Take by mouth. 06/16/18 06/17/19  [provider]  carbamazepine  (TEGRETOL ) 200 MG tablet Take 200 mg by mouth 3 (three) times daily. 05/20/14 05/20/15  [provider]  chlorpheniramine-HYDROcodone (TUSSIONEX) 10-8 MG/5ML SUER Take 5 mLs by mouth every 12 (twelve) hours as needed for cough. 02/16/15   Billy Junnie HERO, MD  guaiFENesin  (MUCINEX ) 600 MG 12 hr tablet Take 1 tablet (600 mg total) by mouth 2 (two) times daily. 02/16/15   Billy Junnie HERO, MD  ibuprofen  (ADVIL ) 600 MG tablet Take 1 tablet (600 mg total) by mouth every 6 (six) hours as needed. 01/23/19   Law, Alexandra M, PA-C  ipratropium-albuterol  (DUONEB) 0.5-2.5 (3) MG/3ML SOLN Take 3 mLs by nebulization 3 (three) times daily. 02/16/15   Billy Junnie HERO, MD  levofloxacin  (LEVAQUIN ) 500 MG tablet Take 1 tablet (500 mg total) by mouth daily. 02/16/15   Billy Junnie HERO, MD  omeprazole (PRILOSEC) 40 MG capsule Take by mouth. 11/18/18   [provider]  oseltamivir  (TAMIFLU ) 75 MG capsule Take 1 capsule (75 mg total) by mouth 2 (two) times daily. 02/16/15   Billy,  Junnie HERO, MD  oxyCODONE  (OXY IR/ROXICODONE ) 5 MG immediate release tablet Take 1 tablet (5 mg total) by mouth every 4 (four) hours as needed for moderate pain. 02/16/15   Billy Junnie HERO, MD  oxyCODONE  (ROXICODONE ) 5 MG immediate release tablet Take 1 tablet (5 mg total) by mouth every 4 (four) hours as needed for up to 7 days for severe pain (pain score 7-10) (pain). 12/11/23 12/18/23  Barton Drape, MD  predniSONE  (DELTASONE ) 10 MG tablet Take 50 mg tablet and taper down by 10 mg daily until completed 02/16/15    Billy Junnie HERO, MD  rizatriptan (MAXALT) 10 MG tablet Take 1 tablet by mouth as needed. For migraine pain 05/20/14   [provider]    Physical Exam: Constitutional: Moderately built and nourished. Vitals:   12/11/23 1343 12/11/23 1817 12/11/23 2207 12/11/23 2216  BP: (!) 161/104 (!) 150/116 (!) 158/109 (!) 166/104  Pulse: 86 86  80  Resp: 17 14  20   Temp: 98.4 F (36.9 C) 99 F (37.2 C)  99.6 F (37.6 C)  TempSrc: Oral Oral    SpO2: 97% 96% 97% 96%   Eyes: Anicteric no pallor. ENMT: No discharge from the ears eyes nose or mouth. Neck: No mass felt.  No neck rigidity. Respiratory: No rhonchi or crepitations. Cardiovascular: S1-S2 heard. Abdomen: Soft nontender bowel sound present. Musculoskeletal: Right knee is dressed.   Skin: Right knee is dressed. Neurologic: Alert awake oriented to time place and person.  Moves all extremities. Psychiatric: Appears normal.  Normal affect.   Labs on Admission: I have personally reviewed following labs and imaging studies  CBC: Recent Labs  Lab 12/11/23 2002  WBC 6.5  NEUTROABS 3.9  HGB 13.2  HCT 41.6  MCV 90.6  PLT 166   Basic Metabolic Panel: Recent Labs  Lab 12/11/23 2002  NA 139  K 3.9  CL 102  CO2 23  GLUCOSE 105*  BUN 13  CREATININE 0.73  CALCIUM  9.2   GFR: CrCl cannot be calculated (Unknown ideal weight.). Liver Function Tests: No results for input(s): AST, ALT, ALKPHOS, BILITOT, PROT, ALBUMIN in the last 168 hours. No results for input(s): LIPASE, AMYLASE in the last 168 hours. No results for input(s): AMMONIA in the last 168 hours. Coagulation Profile: No results for input(s): INR, PROTIME in the last 168 hours. Cardiac Enzymes: No results for input(s): CKTOTAL, CKMB, CKMBINDEX, TROPONINI in the last 168 hours. BNP (last 3 results) No results for input(s): PROBNP in the last 8760 hours. HbA1C: No results for input(s): HGBA1C in the last 72 hours. CBG: No  results for input(s): GLUCAP in the last 168 hours. Lipid Profile: No results for input(s): CHOL, HDL, LDLCALC, TRIG, CHOLHDL, LDLDIRECT in the last 72 hours. Thyroid Function Tests: No results for input(s): TSH, T4TOTAL, FREET4, T3FREE, THYROIDAB in the last 72 hours. Anemia Panel: No results for input(s): VITAMINB12, FOLATE, FERRITIN, TIBC, IRON, RETICCTPCT in the last 72 hours. Urine analysis:    Component Value Date/Time   COLORURINE YELLOW 02/14/2015 1830   APPEARANCEUR CLEAR 02/14/2015 1830   LABSPEC 1.026 02/14/2015 1830   PHURINE 6.0 02/14/2015 1830   GLUCOSEU NEGATIVE 02/14/2015 1830   HGBUR NEGATIVE 02/14/2015 1830   BILIRUBINUR NEGATIVE 02/14/2015 1830   KETONESUR NEGATIVE 02/14/2015 1830   PROTEINUR NEGATIVE 02/14/2015 1830   NITRITE NEGATIVE 02/14/2015 1830   LEUKOCYTESUR NEGATIVE 02/14/2015 1830   Sepsis Labs: @LABRCNTIP (procalcitonin:4,lacticidven:4) )No results found for this or any previous visit (from the past 240 hours).   Radiological  Exams on Admission: DG Knee Complete 4 Views Right Result Date: 12/11/2023 CLINICAL DATA:  pain EXAM: RIGHT KNEE - COMPLETE 4+ VIEW COMPARISON:  None Available. FINDINGS: No evidence of fracture, dislocation. Joint effusion. No evidence of arthropathy or other focal bone abnormality. Soft tissues are unremarkable. IMPRESSION: 1. No acute displaced fracture or dislocation. 2. Joint effusion. Electronically Signed   By: Morgane  Naveau M.D.   On: 12/11/2023 21:49      Assessment/Plan Principal Problem:   Septic arthritis [place])    Right knee septic arthritis appreciate orthopedic surgery consult.  Status post right knee irrigation and debridement with extensive synovectomy and synovial biopsies.  Patient is on empiric antibiotics.  Per orthopedics patient will need at least 6 weeks of antibiotics.  Consult infectious disease in the morning.  Follow cultures. Hypertension takes metoprolol .   Follow blood pressure trends. Hyperlipidemia on statins. Migraine on carbamazepine  and Topamax  as prophylaxis.  Since patient has septic arthritis will need close monitoring with IV antibiotics and more than 2 midnight stay.   DVT prophylaxis: Aspirin . Code Status: Full code. Family Communication: Discussed with patient. Disposition Plan: Medical floor. Consults called: Orthopedics. Admission status: Inpatient.

## 2023-12-11 NOTE — Anesthesia Procedure Notes (Signed)
 Procedure Name: Intubation Date/Time: 12/11/2023 11:34 PM  Performed by: Cena Epps, CRNAPre-anesthesia Checklist: Patient identified, Emergency Drugs available, Suction available and Patient being monitored Patient Re-evaluated:Patient Re-evaluated prior to induction Oxygen Delivery Method: Circle System Utilized Preoxygenation: Pre-oxygenation with 100% oxygen Induction Type: IV induction Ventilation: Unable to mask ventilate and Oral airway inserted - appropriate to patient size Laryngoscope Size: Mac and 4 Grade View: Grade I Tube type: Oral Tube size: 7.0 mm Number of attempts: 1 Airway Equipment and Method: Stylet and Oral airway Placement Confirmation: ETT inserted through vocal cords under direct vision, positive ETCO2 and breath sounds checked- equal and bilateral Secured at: 23 cm Tube secured with: Tape Dental Injury: Teeth and Oropharynx as per pre-operative assessment

## 2023-12-11 NOTE — Op Note (Addendum)
 12/11/2023  11:33 PM   PATIENT: Marc Liu  48 y.o. male  MRN: [id number]   PRE-OPERATIVE DIAGNOSIS:   Right knee septic arthritis   POST-OPERATIVE DIAGNOSIS:   Same   PROCEDURE: Right knee irrigation and debridement with extensive synovectomy and synovial biopsies   SURGEON:  Lillia Mountain, MD   ASSISTANT: None   ANESTHESIA: General, regional   EBL: Minimal   TOURNIQUET:    Total Tourniquet Time Documented: Thigh (Right) - 43 minutes Total: Thigh (Right) - 43 minutes    COMPLICATIONS: None apparent   DISPOSITION: Extubated, awake and stable to recovery.   INDICATION FOR PROCEDURE: The patient presented with above diagnosis.  We discussed the diagnosis, alternative treatment options, risks and benefits of the above surgical intervention, as well as alternative non-operative treatments. All questions/concerns were addressed and the patient/family demonstrated appropriate understanding of the diagnosis, the procedure, the postoperative course, and overall prognosis. The patient wished to proceed with surgical intervention and signed an informed surgical consent as such, in each others presence prior to surgery.   PROCEDURE IN DETAIL: After preoperative consent was obtained and the correct operative site was identified, the patient was brought to the operating room supine on stretcher and transferred onto operating table. General anesthesia was induced. Preoperative antibiotics were administered. Surgical timeout was taken. The patient was then positioned supine with an ipsilateral hip bump. The operative lower extremity was prepped and draped in standard sterile fashion with a tourniquet around the thigh. The extremity was exsanguinated and the tourniquet was inflated to 275 mmHg.  We began by making a standard midline approach over the anterior knee. Dissection was carried down to the level of the quadriceps tendon and patellar retinaculum.  Approximately 30 cc of frankly purulent fluid was aspirated from within the knee joint and sent for routine analysis and cultures.   We then performed a medial parapatellar arthrotomy and encountered copious frank purulence. This was evacuated and then extensive synovitis was noted. Extensive radical synovectomy was performed of the knee joint with healthy bleeding tissue remaining.   The surgical sites were thoroughly irrigated with several liters of normal saline using pulse evac. The tourniquet was deflated and hemostasis achieved. Irrisept and Surgiphor solutions as well as vancomycin  powder were applied. The arthrotomy was closed using 0 PDS and the deep layers were closed using 2-0 PDS. The skin was closed without tension using a combination of prolene and staples.    The leg was cleaned with saline and sterile dressings with gauze were applied. The patient was awakened from anesthesia and transported to the recovery room in stable condition.    FOLLOW UP PLAN: -transfer to PACU, then RNF under hospital medicine -WBAT operative extremity, no active/passive knee flexion past 90 deg until wound healed -maintain Aquacel x 7 days postop, reinforce/replace as needed -trend intra-op cultures and path -trend CRP ESR and WBC -IV abx per primary team/ID recs -ID consult, anticipate at least 6 wk of outpatient IV antibiotic therapy given extensive infectious appearance intraop -DVT ppx: Aspirin  81 mg twice daily or per primary team -pain rx printed and given to his partner Inocencio Roy (Oxycodone  5 mg x 40 tabs) -follow up as outpatient within 7-10 days for wound check -sutures & staples out in 2-3 weeks in outpatient office   RADIOGRAPHS: None used   Lillia Mountain Orthopaedic Surgery [facility]

## 2023-12-11 NOTE — Transfer of Care (Signed)
 Immediate Anesthesia Transfer of Care Note  Patient: Marc Liu  Procedure(s) Performed: IRRIGATION AND DEBRIDEMENT KNEE (Right: Knee)  Patient Location: PACU  Anesthesia Type:General  Level of Consciousness: awake and patient cooperative  Airway & Oxygen Therapy: Patient Spontanous Breathing and Patient connected to face mask oxygen  Post-op Assessment: Report given to RN and Post -op Vital signs reviewed and stable  Post vital signs: Reviewed and stable  Last Vitals:  Vitals Value Taken Time  BP 169/117 12/11/23 23:51  Temp    Pulse 99 12/11/23 23:55  Resp 16 12/11/23 23:55  SpO2 98 % 12/11/23 23:55  Vitals shown include unfiled device data.  Last Pain:  Vitals:   12/11/23 2152  TempSrc:   PainSc: 1          Complications: No notable events documented.

## 2023-12-11 NOTE — ED Notes (Signed)
PT to X-ray

## 2023-12-11 NOTE — H&P (Signed)
 H&P Update:  -History and Physical Reviewed  -Patient has been re-examined  -No change in the plan of care  -The risks and benefits were presented and reviewed. The risks due to suture failure and/or irritation, new/persistent infection, stiffness, nerve/vessel/tendon injury or rerupture of repaired tendon, nonunion/malunion, allograft usage, wound healing issues, development of arthritis, failure of this surgery, possibility of external fixation with delayed definitive surgery, need for further surgery, thromboembolic events, anesthesia/medical complications, amputation, death among others were discussed. The patient acknowledged the explanation, agreed to proceed with the plan and a consent was signed.  Marc Liu

## 2023-12-11 NOTE — Anesthesia Preprocedure Evaluation (Signed)
 Anesthesia Evaluation  Patient identified by MRN, date of birth, ID band Patient awake    Reviewed: Allergy & Precautions, NPO status , Patient's Chart, lab work & pertinent test results  Airway Mallampati: II  TM Distance: >3 FB Neck ROM: Full    Dental no notable dental hx. (+) Upper Dentures, Lower Dentures   Pulmonary shortness of breath, pneumonia, resolved, former smoker   breath sounds clear to auscultation + decreased breath sounds      Cardiovascular negative cardio ROS + Valvular Problems/Murmurs  Rhythm:Regular Rate:Normal     Neuro/Psych  Headaches PSYCHIATRIC DISORDERS  Depression Bipolar Disorder      GI/Hepatic negative GI ROS, Neg liver ROS,,,  Endo/Other  Obesity HLD  Renal/GU negative Renal ROS  negative genitourinary   Musculoskeletal Septic arthritis right knee   Abdominal  (+) + obese  Peds  Hematology negative hematology ROS (+)   Anesthesia Other Findings   Reproductive/Obstetrics                              Anesthesia Physical Anesthesia Plan  ASA: 2 and emergent  Anesthesia Plan: General   Post-op Pain Management: Dilaudid  IV, Precedex and Tylenol  PO (pre-op)*   Induction: Intravenous  PONV Risk Score and Plan: 3 and Treatment may vary due to age or medical condition, Midazolam , Ondansetron  and Dexamethasone   Airway Management Planned: LMA  Additional Equipment: None  Intra-op Plan:   Post-operative Plan: Extubation in OR  Informed Consent: I have reviewed the patients History and Physical, chart, labs and discussed the procedure including the risks, benefits and alternatives for the proposed anesthesia with the patient or authorized representative who has indicated his/her understanding and acceptance.     Dental advisory given  Plan Discussed with: CRNA and Anesthesiologist  Anesthesia Plan Comments:          Anesthesia Quick  Evaluation

## 2023-12-11 NOTE — Consult Note (Signed)
 Patient ID: Marc Liu MRN: [id number] DOB/AGE: 1975-06-16 48 y.o.  Admit date: 12/11/2023  Admission Diagnoses:  Principal Problem:   Septic arthritis (HCC)   HPI: Ortho consult for right knee rule out septic arthritis. Pt has had acute on chronic pain x 5 days with known prior meniscus tearing (MRI May 2025) with new onset worsening associated with steroid injection. He had copious cloudy aspirate in office yesterday with fluid analysis results pending. Reports fever/chills over the weekend including today.  Past Medical History: Past Medical History:  Diagnosis Date   Bipolar disorder (HCC)    Depression    Migraines    Shortness of breath dyspnea    Sinusitis     Surgical History: Past Surgical History:  Procedure Laterality Date   right lower leg tendons      Family History: Family History  Problem Relation Age of Onset   Cancer Mother    Cancer Father     Social History: Social History   Socioeconomic History   Marital status: Married    Spouse name: Not on file   Number of children: Not on file   Years of education: Not on file   Highest education level: Not on file  Occupational History   Not on file  Tobacco Use   Smoking status: Former    Current packs/day: 0.00    Average packs/day: 0.5 packs/day for 25.0 years (12.5 ttl pk-yrs)    Types: Cigarettes    Start date: 05/07/1989    Quit date: 05/07/2014    Years since quitting: 9.6   Smokeless tobacco: Never  Substance and Sexual Activity   Alcohol use: Yes    Comment: occ   Drug use: No   Sexual activity: Not on file  Other Topics Concern   Not on file  Social History Narrative   Not on file   Social Drivers of Health   Financial Resource Strain: Low Risk  (08/28/2023)   Received from [facility]   Overall Financial Resource Strain (CARDIA)    Difficulty of Paying Living Expenses: Not hard at all  Food Insecurity: No Food Insecurity (08/28/2023)   Received from [facility]   Hunger Vital Sign    Within the past 12 months, you worried that your food would run out before you got the money to buy more.: Never true    Within the past 12 months, the food you bought just didn't last and you didn't have money to get more.: Never true  Transportation Needs: No Transportation Needs (08/28/2023)   Received from [facility] - Transportation    Lack of Transportation (Medical): No    Lack of Transportation (Non-Medical): No  Physical Activity: Unknown (03/27/2022)   Received from [facility]   Exercise Vital Sign    On average, how many days per week do you engage in moderate to strenuous exercise (like a brisk walk)?: Patient declined    On average, how many minutes do you engage in exercise at this level?: 30 min  Stress: Patient Declined (03/27/2022)   Received from [facility] - Occupational Stress Questionnaire    Feeling of Stress : Patient declined  Social Connections: Socially Integrated (03/27/2022)   Received from [facility]   Social Network    How would you rate your social network (family, work, friends)?: Good participation with social networks  Intimate Partner Violence: Not At Risk (03/27/2022)   Received from  [facility]   HITS    Over the last 12 months how often did your partner physically hurt you?: Never    Over the last 12 months how often did your partner insult you or talk down to you?: Never    Over the last 12 months how often did your partner threaten you with physical harm?: Never    Over the last 12 months how often did your partner scream or curse at you?: Never    Allergies: Patient has no known allergies.  Medications: I have reviewed the patient's current medications.  Vital Signs: Patient Vitals for the past 24 hrs:  BP Temp Temp src Pulse Resp SpO2  12/11/23 2216 (!) 166/104 99.6 F (37.6 C) -- 80 20 96 %  12/11/23 2207 (!) 158/109 -- -- -- -- 97 %  12/11/23  1817 (!) 150/116 99 F (37.2 C) Oral 86 14 96 %  12/11/23 1343 (!) 161/104 98.4 F (36.9 C) Oral 86 17 97 %    Radiology: DG Knee Complete 4 Views Right Result Date: 12/11/2023 CLINICAL DATA:  pain EXAM: RIGHT KNEE - COMPLETE 4+ VIEW COMPARISON:  None Available. FINDINGS: No evidence of fracture, dislocation. Joint effusion. No evidence of arthropathy or other focal bone abnormality. Soft tissues are unremarkable. IMPRESSION: 1. No acute displaced fracture or dislocation. 2. Joint effusion. Electronically Signed   By: Morgane  Naveau M.D.   On: 12/11/2023 21:49    Labs: Recent Labs    12/11/23 2002  WBC 6.5  RBC 4.59  HCT 41.6  PLT 166   Recent Labs    12/11/23 2002  NA 139  K 3.9  CL 102  CO2 23  BUN 13  CREATININE 0.73  GLUCOSE 105*  CALCIUM  9.2   No results for input(s): LABPT, INR in the last 72 hours.  Review of Systems: ROS as detailed in HPI  Physical Exam: There is no height or weight on file to calculate BMI.  Physical Exam   Gen: AAOx3, NAD Comfortable at rest  Right Lower Extremity: Skin intact TTP over right knee Pain with small arcs of motion right knee ADF/APF/EHL 5/5 SILT throughout DP, PT 2+ to palp CR < 2s   Assessment and Plan: Ortho consult for right knee septic arthritis  -history, exam and imaging reviewed at length with patient/wife at bedside -pt has concerning findings for right knee septic arthritis. Pt has had acute on chronic pain x 5 days with known prior meniscus tearing (MRI May 2025) with new onset worsening associated with steroid injection. He had copious cloudy aspirate in office yesterday with fluid analysis results pending -admit to hospitalist team -plan for emergent right knee I&D given concerning clinical findings and history consistent with acute septic arthritis of right knee. Discussed with anesthesia on call and OR control desk that if not surgery tonight, there may well be delay into tomorrow evening. We will  proceed with surgery now based on available clinical info to achieve potential source control especially in absence of any other sources and presence of ongoing systemic symptoms of fever/chills. -pt NPO since 11 AM today -preop surgery orders placed -PT/OT postop  Lillia Mountain, MD Orthopaedic Surgeon [facility] [phone]  The risks and benefits were presented and reviewed. The risks due to hardware failure/irritation, new/persistent/recurrent infection, stiffness, nerve/vessel/tendon injury, nonunion/malunion of any fracture, wound healing issues, allograft usage, development of arthritis, failure of this surgery, possibility of external fixation in certain situations, possibility of delayed definitive surgery, need for further  surgery, prolonged wound care including further soft tissue coverage procedures, thromboembolic events, anesthesia/medical complications/events perioperatively and beyond, amputation, death among others were discussed. The patient acknowledged the explanation, agreed to proceed with the plan and a consent was signed.

## 2023-12-11 NOTE — ED Triage Notes (Signed)
 Pt presents with c/o right knee pain. Orthopedic MD wanted him to be seen, fluid drawn off of the knee yesterday and started on antibiotics. Pt has been running fevers and MD is concerned for infection. Knee is swollen and family at bedside report that it is warm to the touch.

## 2023-12-11 NOTE — ED Provider Notes (Signed)
 [place] EMERGENCY DEPARTMENT AT [facility] Provider Note   CSN: [id number] Arrival date & time: 12/11/23  1324     Patient presents with: Knee Pain   Marc Liu is a 48 y.o. male.   The history is provided by the patient, medical records and the spouse. No language interpreter was used.  Knee Pain    48 year old male history of bipolar history of recurrent knee pain presenting with concerns of knee infection.  Patient routinely received steroid injection to his right knee from [facility].  A week ago he went to have his knee injected with steroid and the next day he developed pain to the knee.  Subsequent days he then spiked a fever with increasing pain and swelling to the knee.  Yesterday he went to see his orthopedic specialist who performed a knee aspiration and started him on antibiotic doxycycline.  Despite the procedure he noticed increasing pain swelling and redness to his right knee now radiates down to his right leg.  Pain is sharp and achy worsening with movement.  He also endorsed having fever.  No chest pain no shortness of breath no runny nose sneezing or coughing no urinary symptom and no numbness.  He did reach out to his orthopedic specialist today and was recommended to come to the ER for further care.  No history of PE or DVT.  Prior to Admission medications   Medication Sig Start Date End Date Taking? Authorizing Provider  albuterol  (PROVENTIL ) (2.5 MG/3ML) 0.083% nebulizer solution Take 3 mLs (2.5 mg total) by nebulization every 4 (four) hours as needed for wheezing or shortness of breath. 02/16/15 01/23/19  Billy Junnie HERO, MD  atenolol (TENORMIN) 25 MG tablet Take by mouth. 06/16/18 06/17/19  [provider]  carbamazepine  (TEGRETOL ) 200 MG tablet Take 200 mg by mouth 3 (three) times daily. 05/20/14 05/20/15  [provider]  chlorpheniramine-HYDROcodone (TUSSIONEX) 10-8 MG/5ML SUER Take 5 mLs by mouth every 12 (twelve) hours as  needed for cough. 02/16/15   Billy Junnie HERO, MD  guaiFENesin  (MUCINEX ) 600 MG 12 hr tablet Take 1 tablet (600 mg total) by mouth 2 (two) times daily. 02/16/15   Billy Junnie HERO, MD  ibuprofen  (ADVIL ) 600 MG tablet Take 1 tablet (600 mg total) by mouth every 6 (six) hours as needed. 01/23/19   Law, Alexandra M, PA-C  ipratropium-albuterol  (DUONEB) 0.5-2.5 (3) MG/3ML SOLN Take 3 mLs by nebulization 3 (three) times daily. 02/16/15   Billy Junnie HERO, MD  levofloxacin  (LEVAQUIN ) 500 MG tablet Take 1 tablet (500 mg total) by mouth daily. 02/16/15   Billy Junnie HERO, MD  omeprazole (PRILOSEC) 40 MG capsule Take by mouth. 11/18/18   [provider]  oseltamivir  (TAMIFLU ) 75 MG capsule Take 1 capsule (75 mg total) by mouth 2 (two) times daily. 02/16/15   Billy Junnie HERO, MD  oxyCODONE  (OXY IR/ROXICODONE ) 5 MG immediate release tablet Take 1 tablet (5 mg total) by mouth every 4 (four) hours as needed for moderate pain. 02/16/15   Billy Junnie HERO, MD  predniSONE  (DELTASONE ) 10 MG tablet Take 50 mg tablet and taper down by 10 mg daily until completed 02/16/15   Billy Junnie HERO, MD  rizatriptan (MAXALT) 10 MG tablet Take 1 tablet by mouth as needed. For migraine pain 05/20/14   [provider]    Allergies: Patient has no known allergies.    Review of Systems  All other systems reviewed and are negative.   Updated Vital Signs BP ROLLEN)  150/116 (BP Location: Right Arm)   Pulse 86   Temp 99 F (37.2 C) (Oral)   Resp 14   SpO2 96%   Physical Exam Constitutional:      General: He is not in acute distress.    Appearance: He is well-developed.  HENT:     Head: Atraumatic.  Eyes:     Conjunctiva/sclera: Conjunctivae normal.  Cardiovascular:     Rate and Rhythm: Normal rate and regular rhythm.     Pulses: Normal pulses.     Heart sounds: Normal heart sounds.  Pulmonary:     Effort: Pulmonary effort is normal.     Breath sounds: Normal breath sounds.  Abdominal:     Palpations: Abdomen is  soft.     Tenderness: There is no abdominal tenderness.  Musculoskeletal:        General: Swelling (Right lower extremity: Moderate edema and warmth noted to the right knee extending towards the right lower extremity and ankle with tenderness to palpation about the knee.  Patient is able to flex and extend knee with some difficulty.  No crepitus. DP 2+) present.     Cervical back: Normal range of motion and neck supple.  Skin:    Findings: No rash.  Neurological:     Mental Status: He is alert.     (all labs ordered are listed, but only abnormal results are displayed) Labs Reviewed  BASIC METABOLIC PANEL WITH GFR - Abnormal; Notable for the following components:      Result Value   Glucose, Bld 105 (*)    All other components within normal limits  CULTURE, BLOOD (ROUTINE X 2)  CULTURE, BLOOD (ROUTINE X 2)  CBC WITH DIFFERENTIAL/PLATELET  LACTIC ACID, PLASMA  SEDIMENTATION RATE  C-REACTIVE PROTEIN    EKG: None  Radiology: DG Knee Complete 4 Views Right Result Date: 12/11/2023 CLINICAL DATA:  pain EXAM: RIGHT KNEE - COMPLETE 4+ VIEW COMPARISON:  None Available. FINDINGS: No evidence of fracture, dislocation. Joint effusion. No evidence of arthropathy or other focal bone abnormality. Soft tissues are unremarkable. IMPRESSION: 1. No acute displaced fracture or dislocation. 2. Joint effusion. Electronically Signed   By: Morgane  Naveau M.D.   On: 12/11/2023 21:49     Procedures   Medications Ordered in the ED  morphine  (PF) 4 MG/ML injection 4 mg (4 mg Intravenous Given 12/11/23 2122)                                    Medical Decision Making Amount and/or Complexity of Data Reviewed Labs: ordered. Radiology: ordered.  Risk Prescription drug management.   BP (!) 150/116 (BP Location: Right Arm)   Pulse 86   Temp 99 F (37.2 C) (Oral)   Resp 14   SpO2 96%   14:27 PM  48 year old male history of bipolar history of recurrent knee pain presenting with concerns of  knee infection.  Patient routinely received steroid injection to his right knee from [facility].  A week ago he went to have his knee injected with steroid and the next day he developed pain to the knee.  Subsequent days he then spiked a fever with increasing pain and swelling to the knee.  Yesterday he went to see his orthopedic specialist who performed a knee aspiration and started him on antibiotic doxycycline.  Despite the procedure he noticed increasing pain swelling and redness to his right knee now radiates down to  his right leg.  Pain is sharp and achy worsening with movement.  He also endorsed having fever.  No chest pain no shortness of breath no runny nose sneezing or coughing no urinary symptom and no numbness.  He did reach out to his orthopedic specialist today and was recommended to come to the ER for further care.  No history of PE or DVT.  Exam notable for tenderness and swelling about the right knee extending towards the right lower extremity warmth but no significant erythema noted.  Patient is able to range his knee with some difficulty.  Vitals are notable for oral temperature of 99.0.  He is not tachycardic he is not hypotensive and no hypoxia.  -Labs ordered, independently viewed and interpreted by me.  Labs remarkable for normal VC, electrolyte panels are reassuring, normal lactic acid, sed rate and C-reactive protein is currently pending. -The patient was maintained on a cardiac monitor.  I personally viewed and interpreted the cardiac monitored which showed an underlying rhythm of: NSR -Imaging including Xray of R knee ordered, it has not resulted yet.  -This patient presents to the ED for concern of knee pain, this involves an extensive number of treatment options, and is a complaint that carries with it a high risk of complications and morbidity.  The differential diagnosis includes septic joint, cellulitis, gout, meniscal tear, sprain, strain -Co morbidities that complicate  the patient evaluation includes bipolar -Treatment includes morphine  -Reevaluation of the patient after these medicines showed that the patient improved -PCP office notes or outside notes reviewed -Discussion with specialist orthopedist DR. Ramanathan who request NPO, medicine admission,  xray of R knee. Will post pt to the OR for tomorrow.  Appreciate consultation from [facility] Dr. Franky who agrees to admit pt for septic arthritis rule out.  -Escalation to admission/observation considered: pt agrees with admission.        Final diagnoses:  Effusion of right knee joint    ED Discharge Orders     None          Nivia Colon, PA-C 12/11/23 2155    Cottie Donnice PARAS, MD 12/11/23 [id number]

## 2023-12-11 NOTE — Discharge Instructions (Signed)
 Lillia Mountain, MD [facility]  Please read the following information regarding your care after surgery.  Medications  You only need a prescription for the narcotic pain medicine (ex. oxycodone , Percocet, Norco).  All of the other medicines listed below are available over the counter. ? acetaminophen  (Tylenol ) 500 mg every 4-6 hours as you need for minor to moderate pain  ? To help prevent blood clots, take aspirin  (81 mg) twice daily for 28 days after surgery.  You should also get up every hour while you are awake to move around.  Weight Bearing ? OK to walk on the operative leg. No knee flexion past 90 degrees until cleared by your surgeon.  Cast / Splint / Dressing ? Keep your dressing clean and dry.  Don't put anything (coat hanger, pencil, etc) down inside of it.  If it gets wet, please notify the office immediately.  Swelling IMPORTANT: It is normal for you to have swelling where you had surgery. To reduce swelling and pain, keep at least 3 pillows under your leg so that your toes are above your nose and your heel is above the level of your hip.  It may be necessary to keep your foot or leg elevated for several weeks.  This is critical to helping your incisions heal and your pain to feel better.  Follow Up Call my office at [phone] when you are discharged from the hospital or surgery center to schedule an appointment to be seen within 7-10 days after surgery.  Call my office at [phone] if you develop a fever >101.5 F, nausea, vomiting, bleeding from the surgical site or severe pain.

## 2023-12-11 NOTE — Anesthesia Procedure Notes (Signed)
 Procedure Name: LMA Insertion Date/Time: 12/11/2023 10:28 PM  Performed by: Cena Epps, CRNAPre-anesthesia Checklist: Patient identified, Emergency Drugs available, Suction available and Patient being monitored Patient Re-evaluated:Patient Re-evaluated prior to induction Oxygen Delivery Method: Circle System Utilized Preoxygenation: Pre-oxygenation with 100% oxygen Induction Type: IV induction Ventilation: Mask ventilation without difficulty LMA: LMA inserted LMA Size: 4.0 Number of attempts: 1 Airway Equipment and Method: Bite block Placement Confirmation: positive ETCO2 Tube secured with: Tape Dental Injury: Teeth and Oropharynx as per pre-operative assessment

## 2023-12-12 ENCOUNTER — Encounter (HOSPITAL_COMMUNITY): Payer: Self-pay | Admitting: Internal Medicine

## 2023-12-12 ENCOUNTER — Other Ambulatory Visit: Payer: Self-pay

## 2023-12-12 DIAGNOSIS — M009 Pyogenic arthritis, unspecified: Secondary | ICD-10-CM | POA: Diagnosis not present

## 2023-12-12 DIAGNOSIS — I10 Essential (primary) hypertension: Secondary | ICD-10-CM | POA: Diagnosis not present

## 2023-12-12 DIAGNOSIS — G43909 Migraine, unspecified, not intractable, without status migrainosus: Secondary | ICD-10-CM | POA: Diagnosis not present

## 2023-12-12 DIAGNOSIS — E785 Hyperlipidemia, unspecified: Secondary | ICD-10-CM | POA: Diagnosis not present

## 2023-12-12 LAB — COMPREHENSIVE METABOLIC PANEL WITH GFR
ALT: 18 U/L (ref 0–44)
AST: 24 U/L (ref 15–41)
Albumin: 3.9 g/dL (ref 3.5–5.0)
Alkaline Phosphatase: 96 U/L (ref 38–126)
Anion gap: 13 (ref 5–15)
BUN: 14 mg/dL (ref 6–20)
CO2: 23 mmol/L (ref 22–32)
Calcium: 8.9 mg/dL (ref 8.9–10.3)
Chloride: 101 mmol/L (ref 98–111)
Creatinine, Ser: 0.81 mg/dL (ref 0.61–1.24)
GFR, Estimated: >60 mL/min (ref >60)
Glucose, Bld: 106 mg/dL — ABNORMAL HIGH (ref 70–99)
Potassium: 4.2 mmol/L (ref 3.5–5.1)
Sodium: 138 mmol/L (ref 135–145)
Total Bilirubin: 0.4 mg/dL (ref 0.0–1.2)
Total Protein: 7.2 g/dL (ref 6.5–8.1)

## 2023-12-12 LAB — SYNOVIAL CELL COUNT + DIFF, W/ CRYSTALS
Crystals, Fluid: NONE SEEN
Eosinophils-Synovial: 0 % (ref 0–1)
Lymphocytes-Synovial Fld: 3 % (ref 0–20)
Monocyte-Macrophage-Synovial Fluid: 10 % — ABNORMAL LOW (ref 50–90)
Neutrophil, Synovial: 87 % — ABNORMAL HIGH (ref 0–25)
Other Cells-SYN: 0
WBC, Synovial: 90720 /mm3 — ABNORMAL HIGH (ref 0–200)

## 2023-12-12 LAB — CBC
HCT: 42.4 % (ref 39.0–52.0)
Hemoglobin: 13.7 g/dL (ref 13.0–17.0)
MCH: 29.8 pg (ref 26.0–34.0)
MCHC: 32.3 g/dL (ref 30.0–36.0)
MCV: 92.2 fL (ref 80.0–100.0)
Platelets: 180 K/uL (ref 150–400)
RBC: 4.6 MIL/uL (ref 4.22–5.81)
RDW: 13.2 % (ref 11.5–15.5)
WBC: 9.5 K/uL (ref 4.0–10.5)
nRBC: 0.2 % (ref 0.0–0.2)

## 2023-12-12 LAB — HIV ANTIBODY (ROUTINE TESTING W REFLEX): HIV Screen 4th Generation wRfx: NONREACTIVE

## 2023-12-12 LAB — CK: Total CK: 147 U/L (ref 49–397)

## 2023-12-12 MED ORDER — TOPIRAMATE 25 MG PO TABS
75.0000 mg | ORAL_TABLET | Freq: Every day | ORAL | Status: DC
  Administered 2023-12-12 – 2023-12-16 (×5): 75 mg via ORAL
  Filled 2023-12-12 (×5): qty 3

## 2023-12-12 MED ORDER — HYDROMORPHONE HCL 1 MG/ML IJ SOLN
INTRAMUSCULAR | Status: AC
  Filled 2023-12-12: qty 1

## 2023-12-12 MED ORDER — ROSUVASTATIN CALCIUM 20 MG PO TABS
40.0000 mg | ORAL_TABLET | Freq: Every day | ORAL | Status: DC
  Administered 2023-12-12 – 2023-12-16 (×5): 40 mg via ORAL
  Filled 2023-12-12 (×5): qty 2

## 2023-12-12 MED ORDER — VANCOMYCIN HCL IN DEXTROSE 1-5 GM/200ML-% IV SOLN
1000.0000 mg | Freq: Two times a day (BID) | INTRAVENOUS | Status: DC
  Administered 2023-12-12: 1000 mg via INTRAVENOUS
  Filled 2023-12-12: qty 200

## 2023-12-12 MED ORDER — SODIUM CHLORIDE 0.9 % IV SOLN
2.0000 g | INTRAVENOUS | Status: DC
  Administered 2023-12-12: 2 g via INTRAVENOUS
  Filled 2023-12-12: qty 20

## 2023-12-12 MED ORDER — ASPIRIN 81 MG PO TBEC
81.0000 mg | DELAYED_RELEASE_TABLET | Freq: Two times a day (BID) | ORAL | Status: DC
  Administered 2023-12-12 – 2023-12-16 (×9): 81 mg via ORAL
  Filled 2023-12-12 (×9): qty 1

## 2023-12-12 MED ORDER — DAPTOMYCIN-SODIUM CHLORIDE 700-0.9 MG/100ML-% IV SOLN
700.0000 mg | Freq: Every day | INTRAVENOUS | Status: DC
  Administered 2023-12-12 – 2023-12-14 (×3): 700 mg via INTRAVENOUS
  Filled 2023-12-12 (×3): qty 100

## 2023-12-12 MED ORDER — CARBAMAZEPINE 200 MG PO TABS
200.0000 mg | ORAL_TABLET | Freq: Three times a day (TID) | ORAL | Status: DC
  Administered 2023-12-12 – 2023-12-16 (×13): 200 mg via ORAL
  Filled 2023-12-12 (×14): qty 1

## 2023-12-12 MED ORDER — ACETAMINOPHEN 325 MG PO TABS
650.0000 mg | ORAL_TABLET | Freq: Four times a day (QID) | ORAL | Status: DC | PRN
  Administered 2023-12-12 – 2023-12-15 (×10): 650 mg via ORAL
  Filled 2023-12-12 (×12): qty 2

## 2023-12-12 MED ORDER — METHOCARBAMOL 500 MG PO TABS
500.0000 mg | ORAL_TABLET | Freq: Four times a day (QID) | ORAL | Status: AC | PRN
  Administered 2023-12-12 – 2023-12-13 (×2): 500 mg via ORAL
  Filled 2023-12-12 (×2): qty 1

## 2023-12-12 MED ORDER — MORPHINE SULFATE (PF) 2 MG/ML IV SOLN
1.0000 mg | INTRAVENOUS | Status: DC | PRN
  Administered 2023-12-12 – 2023-12-13 (×3): 2 mg via INTRAVENOUS
  Filled 2023-12-12 (×3): qty 1

## 2023-12-12 MED ORDER — METOPROLOL SUCCINATE ER 50 MG PO TB24
50.0000 mg | ORAL_TABLET | Freq: Every day | ORAL | Status: DC
  Administered 2023-12-12 – 2023-12-16 (×5): 50 mg via ORAL
  Filled 2023-12-12 (×5): qty 1

## 2023-12-12 MED ORDER — OXYCODONE HCL 5 MG PO TABS
5.0000 mg | ORAL_TABLET | Freq: Four times a day (QID) | ORAL | Status: AC | PRN
  Administered 2023-12-12 – 2023-12-15 (×9): 5 mg via ORAL
  Filled 2023-12-12 (×9): qty 1

## 2023-12-12 MED ORDER — ACETAMINOPHEN 650 MG RE SUPP: 650.0000 mg | Freq: Four times a day (QID) | RECTAL | Status: DC | PRN

## 2023-12-12 MED ORDER — LACTATED RINGERS IV SOLN: INTRAVENOUS | Status: AC

## 2023-12-12 MED ORDER — SODIUM CHLORIDE 0.9 % IV SOLN
2.0000 g | INTRAVENOUS | Status: DC
  Administered 2023-12-13: 2 g via INTRAVENOUS
  Filled 2023-12-12: qty 20

## 2023-12-12 MED ORDER — ALUM & MAG HYDROXIDE-SIMETH 200-200-20 MG/5ML PO SUSP
15.0000 mL | Freq: Four times a day (QID) | ORAL | Status: DC | PRN
  Administered 2023-12-12: 15 mL via ORAL
  Filled 2023-12-12: qty 30

## 2023-12-12 NOTE — Evaluation (Signed)
 Occupational Therapy Evaluation Patient Details Name: Marc Liu MRN: [id number] DOB: 03-10-1976 Today's Date: 12/12/2023   History of Present Illness   Marc Liu is a 48 y.o. male with history of hypertension, hyperlipidemia, migraines presents to the ER because of worsening swelling of the right knee and pain.  Patient states about a week ago patient had swelling of his right knee.  Denies any trauma or insect bites.  Had gone to orthopedic office where patient had steroid joint injection.  Following which patient's pain actually worsened and had presented to the orthopedic office yesterday when patient had arthrocentesis done labs were still pending was prescribed antibiotics.  Patient presents to the ER because of worsening pain and swelling.  Patient also had subjective feeling of fever chills at home.     Clinical Impressions Pt seen for OT/PT co-evaluation this date, POD#1 from above knee surgery. Supportive family at bedside. Pt was independent in all ADLs prior to surgery, however occasionally needing his wife to assist with MOBILITY/ADL due to L knee pain. Pt is eager to return to PLOF with less pain and improved safety and independence. Pt and wife report he will have someone with him 24/7 follow his discharge. Pt currently requires minimal assist for LB dressing while in seated position due to pain and limited AROM of L knee. Pt simulated toilet transfer into recliner, minimal verbal cues for safe technique and hand placement. Pt instructed falls prevention strategies, home/routines modifications, DME/AE for LB bathing and dressing tasks. VSS, room air throughout - RN aware. Pt would benefit from skilled OT services including additional instruction in dressing techniques with or without assistive devices for dressing and bathing skills to support recall and carryover prior to discharge and ultimately to maximize safety, independence, and minimize falls risk and  caregiver burden. Do not currently anticipate any OT needs following this hospitalization.          If plan is discharge home, recommend the following:   A little help with walking and/or transfers;A little help with bathing/dressing/bathroom;Assist for transportation;Help with stairs or ramp for entrance     Functional Status Assessment   Patient has had a recent decline in their functional status and demonstrates the ability to make significant improvements in function in a reasonable and predictable amount of time.     Equipment Recommendations   Other (comment) (Rolling walker)      Precautions/Restrictions   Precautions Precautions: Fall Recall of Precautions/Restrictions: Intact Restrictions Weight Bearing Restrictions Per Provider Order: Yes RLE Weight Bearing Per Provider Order: Weight bearing as tolerated     Mobility Bed Mobility Overal bed mobility: Needs Assistance Bed Mobility: Supine to Sit     Supine to sit: Min assist     General bed mobility comments: LLE support to get off the bed    Transfers Overall transfer level: Needs assistance Equipment used: Rolling walker (2 wheels) Transfers: Sit to/from Stand Sit to Stand: Contact guard assist           General transfer comment: CGA STS from EOB with no UE support      Balance Overall balance assessment: Needs assistance Sitting-balance support: Feet supported, No upper extremity supported Sitting balance-Leahy Scale: Normal Sitting balance - Comments: Steady reaching outside BOS   Standing balance support: Bilateral upper extremity supported, During functional activity Standing balance-Leahy Scale: Fair Standing balance comment: PRN use of RW in static standing  ADL either performed or assessed with clinical judgement   ADL Overall ADL's : Needs assistance/impaired Eating/Feeding: Set up;Sitting                   Lower Body Dressing:  Min assistance   Toilet Transfer: Contact guard assist;Ambulation;Rolling walker (2 wheels) Toilet Transfer Details (indicate cue type and reason): Simulated toilet transfer into recliner with use of RW         Functional mobility during ADLs: Contact guard assist;Rolling walker (2 wheels)                                  Pertinent Vitals/Pain Pain Assessment Pain Assessment: 0-10 Pain Score: 4  Pain Location: Side of knee Pain Descriptors / Indicators: Sharp, Aching Pain Intervention(s): Limited activity within patient's tolerance, Premedicated before session     Extremity/Trunk Assessment Upper Extremity Assessment Upper Extremity Assessment: Overall WFL for tasks assessed   Lower Extremity Assessment Lower Extremity Assessment: Defer to PT evaluation   Cervical / Trunk Assessment Cervical / Trunk Assessment: Normal   Communication Communication Communication: No apparent difficulties   Cognition Arousal: Alert Behavior During Therapy: WFL for tasks assessed/performed Cognition: No apparent impairments             OT - Cognition Comments: A/Ox4                 Following commands: Intact       Cueing  General Comments   Cueing Techniques: Verbal cues  Knee dressing intact pre/post session   Exercises Exercises: Other exercises Other Exercises Other Exercises: Edu: Role of OT eval, safe ADL completion, LB dressing technique with AD         Home Living Family/patient expects to be discharged to:: Private residence Living Arrangements: Spouse/significant other;Children   Type of Home: Mobile home Home Access: Stairs to enter Secretary/administrator of Steps: 7 Entrance Stairs-Rails: None Home Layout: One level     Bathroom Shower/Tub: Chief Strategy Officer: Standard Bathroom Accessibility: Yes How Accessible: Accessible via walker Home Equipment: Other (comment) (Knee scooter)          Prior  Functioning/Environment Prior Level of Function : Driving;Working/employed;Independent/Modified Independent             Mobility Comments: More recently requiring assistance due to knee pain, wife assisting pt PRN ADLs Comments: Indep    OT Problem List: Decreased strength;Decreased activity tolerance;Decreased range of motion;Impaired balance (sitting and/or standing);Decreased coordination;Decreased knowledge of use of DME or AE;Decreased safety awareness   OT Treatment/Interventions: Self-care/ADL training;Therapeutic exercise;DME and/or AE instruction;Therapeutic activities;Patient/family education;Balance training;Energy conservation      OT Goals(Current goals can be found in the care plan section)   Acute Rehab OT Goals Patient Stated Goal: Heal in prep for TKR in future OT Goal Formulation: With patient/family Time For Goal Achievement: 12/26/23 Potential to Achieve Goals: Good ADL Goals Pt Will Perform Grooming: with modified independence;standing Pt Will Perform Lower Body Dressing: with adaptive equipment;with modified independence;sit to/from stand Pt Will Transfer to Toilet: with modified independence;ambulating Pt Will Perform Toileting - Clothing Manipulation and hygiene: with modified independence;sit to/from stand   OT Frequency:  Min 2X/week    Co-evaluation PT/OT/SLP Co-Evaluation/Treatment: Yes Reason for Co-Treatment: For patient/therapist safety;To address functional/ADL transfers   OT goals addressed during session: Proper use of Adaptive equipment and DME;ADL's and self-care      AM-PAC OT 6 Clicks Daily  Activity     Outcome Measure Help from another person eating meals?: None Help from another person taking care of personal grooming?: A Little Help from another person toileting, which includes using toliet, bedpan, or urinal?: A Little Help from another person bathing (including washing, rinsing, drying)?: A Little Help from another person to  put on and taking off regular upper body clothing?: None Help from another person to put on and taking off regular lower body clothing?: A Little 6 Click Score: 20   End of Session Equipment Utilized During Treatment: Rolling walker (2 wheels) Nurse Communication: Mobility status  Activity Tolerance: Patient tolerated treatment well Patient left: in chair;with call bell/phone within reach;with chair alarm set;with family/visitor present  OT Visit Diagnosis: Unsteadiness on feet (R26.81);Other abnormalities of gait and mobility (R26.89);Muscle weakness (generalized) (M62.81);Pain Pain - Right/Left: Left Pain - part of body: Knee                Time: 8842-8781 OT Time Calculation (min): 21 min Charges:  OT General Charges $OT Visit: 1 Visit OT Evaluation $OT Eval Low Complexity: 1 Low  Larraine Colas M.S. OTR/L  12/12/23, 12:34 PM

## 2023-12-12 NOTE — Progress Notes (Signed)
 Subjective: 1 Day Post-Op Procedure(s) (LRB): IRRIGATION AND DEBRIDEMENT KNEE (Right)  Patient reports pain as appropriately controlled. Denies any new numbness/tingling.   Objective:   VITALS:  Temp:  [97.7 F (36.5 C)-99.6 F (37.6 C)] 98.9 F (37.2 C) (09/18 1339) Pulse Rate:  [76-100] 84 (09/18 1339) Resp:  [14-20] 18 (09/18 1339) BP: (144-211)/(97-118) 144/97 (09/18 1339) SpO2:  [90 %-99 %] 93 % (09/18 1339) Weight:  [104.4 kg] 104.4 kg (09/18 0055)  Neurovascular intact Sensation intact distally Intact pulses distally Dorsiflexion/Plantar flexion intact Incision: dressing C/D/I Compartment soft   LABS Recent Labs    12/11/23 2002 12/12/23 0346  HGB 13.2 13.7  WBC 6.5 9.5  PLT 166 180   Recent Labs    12/11/23 2002 12/12/23 0346  NA 139 138  K 3.9 4.2  CL 102 101  CO2 23 23  BUN 13 14  CREATININE 0.73 0.81  GLUCOSE 105* 106*   No results for input(s): LABPT, INR in the last 72 hours.   Assessment/Plan: 1 Day Post-Op Procedure(s) (LRB): IRRIGATION AND DEBRIDEMENT KNEE (Right)  -stable on RNF under hospital medicine -WBAT operative extremity, no active/passive knee flexion past 90 deg until wound healed -maintain Aquacel x 7 days postop, reinforce/replace as needed -trend intra-op cultures and path -trend CRP ESR and WBC -IV abx per primary team/ID recs -ID consult, anticipate at least 6 wk of outpatient IV antibiotic therapy given extensive infectious appearance intraop -DVT ppx: Aspirin  81 mg twice daily or per primary team -pain rx printed and given to his partner Kedric Bumgarner (Oxycodone  5 mg x 40 tabs) -follow up as outpatient within 7-10 days for wound check -sutures & staples out in 2-3 weeks in outpatient office  Lillia Mountain 12/12/2023, 2:04 PM

## 2023-12-12 NOTE — Anesthesia Postprocedure Evaluation (Signed)
 Anesthesia Post Note  Patient: Marc Liu  Procedure(s) Performed: IRRIGATION AND DEBRIDEMENT KNEE (Right: Knee)     Patient location during evaluation: PACU Anesthesia Type: General Level of consciousness: awake and alert and oriented Pain management: pain level controlled Vital Signs Assessment: post-procedure vital signs reviewed and stable Respiratory status: spontaneous breathing, nonlabored ventilation, respiratory function stable and patient connected to nasal cannula oxygen Cardiovascular status: blood pressure returned to baseline and stable Postop Assessment: no apparent nausea or vomiting Anesthetic complications: no Comments: Given Labetalol  IV in PACU for elevated BP. Hospitalists to see patient.   No notable events documented.  Last Vitals:  Vitals:   12/12/23 0030 12/12/23 0040  BP: (!) 153/114 (!) 168/114  Pulse: 76 78  Resp: 14 14  Temp:  36.7 C  SpO2: 99% 95%    Last Pain:  Vitals:   12/12/23 0030  TempSrc:   PainSc: Asleep   Pain Goal:                   [name] A.

## 2023-12-12 NOTE — Progress Notes (Signed)
 Pharmacy Antibiotic Note  Marc Liu is a 48 y.o. male admitted on 12/11/2023 with septic arthritis.  Pharmacy has been consulted for vancomycin  dosing.  Plan: Pt received 1500mg  IV x 1 in OR then 1gm q12h (AUC 451.1, Scr 0.8) Follow renal function, cultures and clinical course    Temp (24hrs), Avg:98.5 F (36.9 C), Min:97.7 F (36.5 C), Max:99.6 F (37.6 C)  Recent Labs  Lab 12/11/23 2002  WBC 6.5  CREATININE 0.73  LATICACIDVEN 1.3    CrCl cannot be calculated (Unknown ideal weight.).    No Known Allergies  Antimicrobials this admission: 9/17 vanc >> 9/17 cefazolin  x1 9/18 CTX >>   Dose adjustments this admission:   Microbiology results: 9/17 BCx:  9/17 soft tissue: 9/17 fluid from wound :  Thank you for allowing pharmacy to be a part of this patient's care.  Leeroy Mace RPh 12/12/2023, 1:17 AM

## 2023-12-12 NOTE — Evaluation (Signed)
 Physical Therapy Evaluation Patient Details Name: Marc Liu MRN: [id number] DOB: 1975-06-08 Today's Date: 12/12/2023  History of Present Illness  Marc Liu is a 48 y.o. male with history of hypertension, hyperlipidemia, migraines presents to the ER because of worsening swelling of the right knee and pain.  Patient states about a week ago patient had swelling of his right knee.  Denies any trauma or insect bites.  Had gone to orthopedic office where patient had steroid joint injection.  Following which patient's pain actually worsened and had presented to the orthopedic office yesterday when patient had arthrocentesis done labs were still pending was prescribed antibiotics.  Patient presents to the ER because of worsening pain and swelling.  Patient also had subjective feeling of fever chills at home.  Clinical Impression  Pt admitted as above and presenting with decreased R LE strength/ROM and post op pain limiting functional mobility.  Pt motivated and should progress to dc home with family assist.       If plan is discharge home, recommend the following: A little help with walking and/or transfers;A little help with bathing/dressing/bathroom;Assistance with cooking/housework;Assist for transportation;Help with stairs or ramp for entrance   Can travel by private vehicle        Equipment Recommendations Rolling walker (2 wheels)  Recommendations for Other Services       Functional Status Assessment Patient has had a recent decline in their functional status and demonstrates the ability to make significant improvements in function in a reasonable and predictable amount of time.     Precautions / Restrictions Precautions Precautions: Fall Recall of Precautions/Restrictions: Intact Restrictions Weight Bearing Restrictions Per Provider Order: Yes RLE Weight Bearing Per Provider Order: Weight bearing as tolerated      Mobility  Bed Mobility Overal bed  mobility: Needs Assistance Bed Mobility: Supine to Sit     Supine to sit: Min assist     General bed mobility comments: RLE support to get off the bed    Transfers Overall transfer level: Needs assistance Equipment used: Rolling walker (2 wheels) Transfers: Sit to/from Stand Sit to Stand: Contact guard assist           General transfer comment: cues for LE management and use of  UEs to self assist    Ambulation/Gait Ambulation/Gait assistance: Min assist, Contact guard assist Gait Distance (Feet): 38 Feet Assistive device: Rolling walker (2 wheels) Gait Pattern/deviations: Step-to pattern, Decreased step length - right, Decreased step length - left, Shuffle, Trunk flexed Gait velocity: decr     General Gait Details: cues for sequence, posture and position from [employer]            Wheelchair Mobility     Tilt Bed    Modified Rankin (Stroke Patients Only)       Balance Overall balance assessment: Needs assistance Sitting-balance support: Feet supported, No upper extremity supported Sitting balance-Leahy Scale: Normal Sitting balance - Comments: Steady reaching outside BOS   Standing balance support: Bilateral upper extremity supported, During functional activity Standing balance-Leahy Scale: Fair Standing balance comment: PRN use of RW in static standing                             Pertinent Vitals/Pain Pain Assessment Pain Assessment: 0-10 Pain Score: 6  Pain Location: R knee with activity Pain Descriptors / Indicators: Sharp, Aching Pain Intervention(s): Limited activity within patient's tolerance, Monitored during session, Premedicated before session, Ice applied  Home Living Family/patient expects to be discharged to:: Private residence Living Arrangements: Spouse/significant other;Children Available Help at Discharge: Family;Available PRN/intermittently Type of Home: House Home Access: Stairs to enter Entrance Stairs-Rails:  None Entrance Stairs-Number of Steps: 7 (Spouse indicates each step is at least 18 deep)   Home Layout: One level Home Equipment: None      Prior Function Prior Level of Function : Driving;Working/employed;Independent/Modified Independent             Mobility Comments: More recently requiring assistance due to knee pain, wife assisting pt PRN ADLs Comments: Indep     Extremity/Trunk Assessment   Upper Extremity Assessment Upper Extremity Assessment: Overall WFL for tasks assessed    Lower Extremity Assessment Lower Extremity Assessment: RLE deficits/detail RLE: Unable to fully assess due to pain    Cervical / Trunk Assessment Cervical / Trunk Assessment: Normal  Communication   Communication Communication: No apparent difficulties    Cognition Arousal: Alert Behavior During Therapy: WFL for tasks assessed/performed   PT - Cognitive impairments: No apparent impairments                         Following commands: Intact       Cueing Cueing Techniques: Verbal cues     General Comments General comments (skin integrity, edema, etc.): Knee dressing intact pre/post session    Exercises General Exercises - Lower Extremity Ankle Circles/Pumps: AROM, Both, 15 reps, Supine   Assessment/Plan    PT Assessment Patient needs continued PT services  PT Problem List Decreased strength;Decreased range of motion;Decreased activity tolerance;Decreased balance;Decreased mobility;Decreased knowledge of use of DME;Pain       PT Treatment Interventions DME instruction;Gait training;Stair training;Functional mobility training;Therapeutic activities;Therapeutic exercise;Patient/family education    PT Goals (Current goals can be found in the Care Plan section)  Acute Rehab PT Goals Patient Stated Goal: Regain IND PT Goal Formulation: With patient Time For Goal Achievement: 12/19/23 Potential to Achieve Goals: Good    Frequency 7X/week     Co-evaluation  PT/OT/SLP Co-Evaluation/Treatment: Yes Reason for Co-Treatment: For patient/therapist safety;To address functional/ADL transfers PT goals addressed during session: Mobility/safety with mobility OT goals addressed during session: Proper use of Adaptive equipment and DME;ADL's and self-care       AM-PAC PT 6 Clicks Mobility  Outcome Measure Help needed turning from your back to your side while in a flat bed without using bedrails?: A Little Help needed moving from lying on your back to sitting on the side of a flat bed without using bedrails?: A Little Help needed moving to and from a bed to a chair (including a wheelchair)?: A Little Help needed standing up from a chair using your arms (e.g., wheelchair or bedside chair)?: A Little Help needed to walk in hospital room?: A Little Help needed climbing 3-5 steps with a railing? : A Lot 6 Click Score: 17    End of Session Equipment Utilized During Treatment: Gait belt Activity Tolerance: Patient tolerated treatment well;Patient limited by pain Patient left: in chair;with bed alarm set;with family/visitor present Nurse Communication: Mobility status PT Visit Diagnosis: Difficulty in walking, not elsewhere classified (R26.2)    Time: 8798-8774 PT Time Calculation (min) (ACUTE ONLY): 24 min   Charges:   PT Evaluation $PT Eval Low Complexity: 1 Low   PT General Charges $$ ACUTE PT VISIT: 1 Visit         [facility] PT Acute Rehabilitation Services Office [phone]   Billy Rocco 12/12/2023, 1:09 PM

## 2023-12-12 NOTE — Plan of Care (Signed)
  Problem: Safety: Goal: Ability to remain free from injury will improve Outcome: Progressing   Problem: Pain Managment: Goal: General experience of comfort will improve and/or be controlled Outcome: Progressing   Problem: Elimination: Goal: Will not experience complications related to urinary retention Outcome: Progressing   Problem: Clinical Measurements: Goal: Ability to maintain clinical measurements within normal limits will improve Outcome: Progressing

## 2023-12-12 NOTE — Progress Notes (Signed)
 Pharmacy Antibiotic Note  Marc Liu is a 48 y.o. male admitted on 12/11/2023 with septic arthritis pending more micro data.  Pharmacy has been consulted for Daptomycin  dosing.  Plan: Start Daptomycin  700mg  q24 hours Check baseline CK Monitor CK weekly while on Dapto Monitor culture updates and renal function  Height: 5' 6.25 (168.3 cm) Weight: 104.4 kg (230 lb 2.6 oz) IBW/kg (Calculated) : 64.38  Temp (24hrs), Avg:98.5 F (36.9 C), Min:97.7 F (36.5 C), Max:99.6 F (37.6 C)  Recent Labs  Lab 12/11/23 2002 12/12/23 0346  WBC 6.5 9.5  CREATININE 0.73 0.81  LATICACIDVEN 1.3  --     Estimated Creatinine Clearance: 124.1 mL/min (by C-G formula based on SCr of 0.81 mg/dL).    No Known Allergies  Antimicrobials this admission: Vancomycin  9/17 >> 9/18 Ceftriaxone  9/18 >>  Daptomycin  9/18 >>    Microbiology results: 9/18 BCx: NGTD 9/17 soft tissue: NGTD 9/17 fluid from wound: NGTD  Thank you for allowing pharmacy to be a part of this patient's care.  Dionicia Canavan, PharmD, RPh PGY1 Acute Care Pharmacy Resident [facility]  12/12/2023 1:33 PM
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
## 2023-12-12 NOTE — Progress Notes (Addendum)
 PROGRESS NOTE    Marc Liu  FMW:[id number] DOB: 05/03/1975 DOA: 12/11/2023 PCP: Radiontchenko, Alexei, MD   Brief Narrative:  Marc Liu is a 48 y.o. male with history of hypertension, hyperlipidemia, migraines presents to the ER because of worsening swelling of the right knee and pain - presented to the orthopedic office 9/16 when patient had arthrocentesis done labs were still pending was prescribed antibiotics with worsening symptoms - evaluated urgently in the ED - requiring I/D of R knee on 9/17.  Assessment & Plan:   Principal Problem:   Septic arthritis (HCC) Active Problems:   Essential hypertension   HLD (hyperlipidemia)   Migraine  Right knee septic arthritis s/p I/D on 9/17 - Appreciate orthopedic surgical consult, tolerated procedure well, pain well-controlled on current regimen including oxycodone  and morphine  - Cultures remain pending, infectious disease consulted for further insight and recommendations (currently recommending daptomycin  and ceftriaxone ) - Patient does not meet sepsis criteria - CRP elevated at 4.6; ESR 35; lactic negative  Hypertension continue home metoprolol  Hyperlipidemia continue rosuvastatin  Migraine continue carbamazepine  and Topamax  as prophylaxis  DVT prophylaxis: SCDs Start: 12/12/23 0052 Code Status:   Code Status: Full Code Family Communication: At bedside  Status is: Inpatient   Dispo: The patient is from: Home               Anticipated d/c is to: To be determined              Anticipated d/c date is: To be determined              Patient currently not medically stable for discharge  Consultants:  Orthopedic surgery Infectious disease  Procedures:  I&D right knee 12/11/2023  Antimicrobials:  Vancomycin , ceftriaxone   Subjective: No acute issues or events overnight, tolerated procedure quite well but pain is somewhat uncontrolled this morning, morphine  increased as above with appropriate control.   Otherwise denies nausea vomiting diarrhea constipation headache fevers chills or chest pain  Objective: Vitals:   12/12/23 0030 12/12/23 0040 12/12/23 0055 12/12/23 0311  BP: (!) 153/114 (!) 168/114 (!) 154/103 (!) 164/104  Pulse: 76 78 82 80  Resp: 14 14 15 14   Temp:  98 F (36.7 C) 98.3 F (36.8 C) 98 F (36.7 C)  TempSrc:   Oral Oral  SpO2: 99% 95% 92% 97%  Weight:   104.4 kg   Height:   5' 6.25 (1.683 m)     Intake/Output Summary (Last 24 hours) at 12/12/2023 0728 Last data filed at 12/12/2023 0600 Gross per 24 hour  Intake 1260.59 ml  Output 350 ml  Net 910.59 ml   Filed Weights   12/12/23 0055  Weight: 104.4 kg    Examination:  General:  Pleasantly resting in bed, mild distress in the setting of lower extremity pain HEENT:  Normocephalic atraumatic.  Sclerae nonicteric, noninjected.  Extraocular movements intact bilaterally. Neck:  Without mass or deformity.  Trachea is midline. Lungs:  Clear to auscultate bilaterally without rhonchi, wheeze, or rales. Heart:  Regular rate and rhythm.  Without murmurs, rubs, or gallops. Abdomen:  Soft, nontender, nondistended.  Without guarding or rebound. Extremities: Right lower extremity bandage clean dry intact  Data Reviewed: I have personally reviewed following labs and imaging studies  CBC: Recent Labs  Lab 12/11/23 2002 12/12/23 0346  WBC 6.5 9.5  NEUTROABS 3.9  --   HGB 13.2 13.7  HCT 41.6 42.4  MCV 90.6 92.2  PLT 166 180   Basic Metabolic Panel: Recent Labs  Lab 12/11/23 2002 12/12/23 0346  NA 139 138  K 3.9 4.2  CL 102 101  CO2 23 23  GLUCOSE 105* 106*  BUN 13 14  CREATININE 0.73 0.81  CALCIUM  9.2 8.9   GFR: Estimated Creatinine Clearance: 124.1 mL/min (by C-G formula based on SCr of 0.81 mg/dL). Liver Function Tests: Recent Labs  Lab 12/12/23 0346  AST 24  ALT 18  ALKPHOS 96  BILITOT 0.4  PROT 7.2  ALBUMIN 3.9   No results for input(s): LIPASE, AMYLASE in the last 168 hours. No  results for input(s): AMMONIA in the last 168 hours. Coagulation Profile: No results for input(s): INR, PROTIME in the last 168 hours. Cardiac Enzymes: No results for input(s): CKTOTAL, CKMB, CKMBINDEX, TROPONINI in the last 168 hours. BNP (last 3 results) No results for input(s): PROBNP in the last 8760 hours. HbA1C: No results for input(s): HGBA1C in the last 72 hours. CBG: No results for input(s): GLUCAP in the last 168 hours. Lipid Profile: No results for input(s): CHOL, HDL, LDLCALC, TRIG, CHOLHDL, LDLDIRECT in the last 72 hours. Thyroid Function Tests: No results for input(s): TSH, T4TOTAL, FREET4, T3FREE, THYROIDAB in the last 72 hours. Anemia Panel: No results for input(s): VITAMINB12, FOLATE, FERRITIN, TIBC, IRON, RETICCTPCT in the last 72 hours. Sepsis Labs: Recent Labs  Lab 12/11/23 2002  LATICACIDVEN 1.3    Recent Results (from the past 240 hours)  Aerobic/Anaerobic Culture w Gram Stain (surgical/deep wound)     Status: None (Preliminary result)   Collection Time: 12/11/23 10:53 PM   Specimen: Soft Tissue, Other  Result Value Ref Range Status   Specimen Description   Final    TISSUE Performed at [facility], [address] [place], [place] [address]    Special Requests   Final    RIGHT KNEE Performed at [facility], [address] [place], [place] [address]    Gram Stain   Final    NO WBC SEEN NO ORGANISMS SEEN Performed at [facility], [address], [place], [place] [address]    Culture PENDING  Incomplete   Report Status PENDING  Incomplete  Aerobic/Anaerobic Culture w Gram Stain (surgical/deep wound)     Status: None (Preliminary result)   Collection Time: 12/11/23 10:54 PM   Specimen: Soft Tissue, Other  Result Value Ref Range Status   Specimen Description   Final    TISSUE Performed at [facility], [address],  [place], [place] [address]    Special Requests   Final    RIGHT KNEE Performed at [facility], [address], [place], [place] [address]    Gram Stain   Final    NO WBC SEEN NO ORGANISMS SEEN Performed at [facility], [address] [place], [place] [address]    Culture PENDING  Incomplete   Report Status PENDING  Incomplete  Aerobic/Anaerobic Culture w Gram Stain (surgical/deep wound)     Status: None (Preliminary result)   Collection Time: 12/11/23 11:00 PM   Specimen: Wound; Body Fluid  Result Value Ref Range Status   Specimen Description   Final    WOUND Performed at [facility], [address], [place], [place] [address]    Special Requests   Final    RIGHT KNEE Performed at [facility], [address] [place], [place] [address]    Gram Stain   Final    RARE WBC PRESENT, PREDOMINANTLY  PMN NO ORGANISMS SEEN Performed at [facility], [address] [place], [place] [address]    Culture PENDING  Incomplete   Report Status PENDING  Incomplete   Radiology Studies: DG Knee Complete 4 Views Right Result Date: 12/11/2023 CLINICAL DATA:  pain EXAM: RIGHT KNEE - COMPLETE 4+ VIEW COMPARISON:  None Available. FINDINGS: No evidence of fracture, dislocation. Joint effusion. No evidence of arthropathy or other focal bone abnormality. Soft tissues are unremarkable. IMPRESSION: 1. No acute displaced fracture or dislocation. 2. Joint effusion. Electronically Signed   By: Morgane  Naveau M.D.   On: 12/11/2023 21:49   Scheduled Meds:  aspirin  EC  81 mg Oral BID   carbamazepine   200 mg Oral TID   metoprolol  succinate  50 mg Oral Daily   rosuvastatin   40 mg Oral Daily   topiramate   75 mg Oral Daily   Continuous Infusions:  cefTRIAXone  (ROCEPHIN )  IV 2 g (12/12/23 0117)   lactated ringers  75 mL/hr at 12/12/23 0201   vancomycin        LOS: 1 day   Time spent:  Elsie JAYSON Montclair, DO [facility]  If 7PM-7AM, please contact night-coverage [email or web address]  12/12/2023, 7:28 AM

## 2023-12-13 DIAGNOSIS — M00861 Arthritis due to other bacteria, right knee: Secondary | ICD-10-CM

## 2023-12-13 DIAGNOSIS — R509 Fever, unspecified: Secondary | ICD-10-CM

## 2023-12-13 DIAGNOSIS — G43909 Migraine, unspecified, not intractable, without status migrainosus: Secondary | ICD-10-CM | POA: Diagnosis not present

## 2023-12-13 DIAGNOSIS — E785 Hyperlipidemia, unspecified: Secondary | ICD-10-CM | POA: Diagnosis not present

## 2023-12-13 DIAGNOSIS — I10 Essential (primary) hypertension: Secondary | ICD-10-CM | POA: Diagnosis not present

## 2023-12-13 DIAGNOSIS — M009 Pyogenic arthritis, unspecified: Secondary | ICD-10-CM | POA: Diagnosis not present

## 2023-12-13 LAB — BASIC METABOLIC PANEL WITH GFR
Anion gap: 13 (ref 5–15)
BUN: 12 mg/dL (ref 6–20)
CO2: 25 mmol/L (ref 22–32)
Calcium: 9 mg/dL (ref 8.9–10.3)
Chloride: 100 mmol/L (ref 98–111)
Creatinine, Ser: 0.73 mg/dL (ref 0.61–1.24)
GFR, Estimated: >60 mL/min (ref >60)
Glucose, Bld: 127 mg/dL — ABNORMAL HIGH (ref 70–99)
Potassium: 3.8 mmol/L (ref 3.5–5.1)
Sodium: 138 mmol/L (ref 135–145)

## 2023-12-13 LAB — CBC
HCT: 40.4 % (ref 39.0–52.0)
Hemoglobin: 12.9 g/dL — ABNORMAL LOW (ref 13.0–17.0)
MCH: 29.6 pg (ref 26.0–34.0)
MCHC: 31.9 g/dL (ref 30.0–36.0)
MCV: 92.7 fL (ref 80.0–100.0)
Platelets: 177 K/uL (ref 150–400)
RBC: 4.36 MIL/uL (ref 4.22–5.81)
RDW: 13.2 % (ref 11.5–15.5)
WBC: 8.1 K/uL (ref 4.0–10.5)
nRBC: 0 % (ref 0.0–0.2)

## 2023-12-13 LAB — SURGICAL PATHOLOGY

## 2023-12-13 NOTE — Progress Notes (Signed)
 Physical Therapy Treatment Patient Details Name: Marc Liu MRN: [id number] DOB: 22-Apr-1975 Today's Date: 12/13/2023   History of Present Illness Marc Liu is a 48 y.o. male with history of hypertension, hyperlipidemia, migraines presents to the ER because of worsening swelling of the right knee and pain.  Patient states about a week ago patient had swelling of his right knee.  Denies any trauma or insect bites.  Had gone to orthopedic office where patient had steroid joint injection.  Following which patient's pain actually worsened and had presented to the orthopedic office yesterday when patient had arthrocentesis done labs were still pending was prescribed antibiotics.  Patient presents to the ER because of worsening pain and swelling.  Patient also had subjective feeling of fever chills at home.    PT Comments  Pt continues very motivated and with noted improvement in activity tolerance and WB tolerance on R LE.  Will follow up with stair training in am.    If plan is discharge home, recommend the following: A little help with walking and/or transfers;A little help with bathing/dressing/bathroom;Assistance with cooking/housework;Assist for transportation;Help with stairs or ramp for entrance   Can travel by private vehicle        Equipment Recommendations  Rolling walker (2 wheels)    Recommendations for Other Services       Precautions / Restrictions Precautions Precautions: Fall Recall of Precautions/Restrictions: Intact Restrictions Weight Bearing Restrictions Per Provider Order: Yes RLE Weight Bearing Per Provider Order: Weight bearing as tolerated     Mobility  Bed Mobility               General bed mobility comments: Pt up in chair and requests back to same    Transfers Overall transfer level: Needs assistance Equipment used: Rolling walker (2 wheels) Transfers: Sit to/from Stand Sit to Stand: Contact guard assist            General transfer comment: cues for LE management and use of  UEs to self assist    Ambulation/Gait Ambulation/Gait assistance: Contact guard assist Gait Distance (Feet): 92 Feet Assistive device: Rolling walker (2 wheels) Gait Pattern/deviations: Step-to pattern, Decreased step length - right, Decreased step length - left, Shuffle, Trunk flexed Gait velocity: decr     General Gait Details: min cues for sequence, posture and position from [employer]             Wheelchair Mobility     Tilt Bed    Modified Rankin (Stroke Patients Only)       Balance Overall balance assessment: Needs assistance Sitting-balance support: Feet supported, No upper extremity supported Sitting balance-Leahy Scale: Normal     Standing balance support: Single extremity supported Standing balance-Leahy Scale: Fair                              Hotel manager: No apparent difficulties  Cognition Arousal: Alert Behavior During Therapy: WFL for tasks assessed/performed   PT - Cognitive impairments: No apparent impairments                         Following commands: Intact      Cueing Cueing Techniques: Verbal cues  Exercises General Exercises - Lower Extremity Ankle Circles/Pumps: AROM, Both, 15 reps, Supine    General Comments        Pertinent Vitals/Pain Pain Assessment Pain Assessment: 0-10 Pain Score: 6  Pain  Location: R knee with activity Pain Descriptors / Indicators: Sharp, Aching Pain Intervention(s): Limited activity within patient's tolerance, Monitored during session, Premedicated before session, Ice applied    Home Living                          Prior Function            PT Goals (current goals can now be found in the care plan section) Acute Rehab PT Goals Patient Stated Goal: Regain IND PT Goal Formulation: With patient Time For Goal Achievement: 12/19/23 Potential to Achieve Goals:  Good Progress towards PT goals: Progressing toward goals    Frequency    7X/week      PT Plan      Co-evaluation              AM-PAC PT 6 Clicks Mobility   Outcome Measure  Help needed turning from your back to your side while in a flat bed without using bedrails?: A Little Help needed moving from lying on your back to sitting on the side of a flat bed without using bedrails?: A Little Help needed moving to and from a bed to a chair (including a wheelchair)?: A Little Help needed standing up from a chair using your arms (e.g., wheelchair or bedside chair)?: A Little Help needed to walk in hospital room?: A Little Help needed climbing 3-5 steps with a railing? : A Lot 6 Click Score: 17    End of Session Equipment Utilized During Treatment: Gait belt Activity Tolerance: Patient tolerated treatment well;Patient limited by pain Patient left: in chair;with bed alarm set;with family/visitor present Nurse Communication: Mobility status PT Visit Diagnosis: Difficulty in walking, not elsewhere classified (R26.2)     Time: 8988-8968 PT Time Calculation (min) (ACUTE ONLY): 20 min  Charges:    $Gait Training: 8-22 mins PT General Charges $$ ACUTE PT VISIT: 1 Visit                     [facility] PT Acute Rehabilitation Services Office [phone]    Katalyn Matin 12/13/2023, 10:34 AM

## 2023-12-13 NOTE — Plan of Care (Signed)
  Problem: Safety: Goal: Ability to remain free from injury will improve Outcome: Progressing   Problem: Pain Managment: Goal: General experience of comfort will improve and/or be controlled Outcome: Progressing   Problem: Elimination: Goal: Will not experience complications related to urinary retention Outcome: Progressing   Problem: Coping: Goal: Level of anxiety will decrease Outcome: Progressing

## 2023-12-13 NOTE — Progress Notes (Signed)
 PROGRESS NOTE    Marc Liu  FMW:[id number] DOB: Nov 06, 1975 DOA: 12/11/2023 PCP: Radiontchenko, Alexei, MD   Brief Narrative:  Marc Liu is a 48 y.o. male with history of hypertension, hyperlipidemia, migraines presents to the ER because of worsening swelling of the right knee and pain - presented to the orthopedic office 9/16 when patient had arthrocentesis done labs were still pending was prescribed antibiotics with worsening symptoms - evaluated urgently in the ED - requiring I/D of R knee on 9/17.  Assessment & Plan:   Principal Problem:   Septic arthritis (HCC) Active Problems:   Essential hypertension   HLD (hyperlipidemia)   Migraine  Right knee septic arthritis s/p I/D on 9/17 - Appreciate orthopedic surgical consult, tolerated procedure well, pain well-controlled on current regimen including oxycodone  and morphine  -wean IV narcotics as appropriate - Cultures remain pending, infectious disease consulted for further insight and recommendations (currently recommending daptomycin  and ceftriaxone )-plan for 6-week duration - Patient does not meet sepsis criteria - CRP elevated at 4.6; ESR 35; lactic negative  Hypertension continue home metoprolol  Hyperlipidemia continue rosuvastatin  Migraine continue carbamazepine  and Topamax  as prophylaxis  DVT prophylaxis: SCDs Start: 12/12/23 0052 Code Status:   Code Status: Full Code Family Communication: At bedside  Status is: Inpatient   Dispo: The patient is from: Home               Anticipated d/c is to: To be determined              Anticipated d/c date is: To be determined              Patient currently not medically stable for discharge  Consultants:  Orthopedic surgery Infectious disease  Procedures:  I&D right knee 12/11/2023  Antimicrobials:  Vancomycin , ceftriaxone   Subjective: No acute issues or events overnight, ambulating better today than previously but still quite limited, pain control  improving but still requiring IV narcotics  Objective: Vitals:   12/12/23 0956 12/12/23 1339 12/12/23 2106 12/13/23 0626  BP: (!) 159/102 (!) 144/97 (!) 157/95 (!) 169/100  Pulse: 79 84 (!) 105 94  Resp: 20 18 16 18   Temp: 99.2 F (37.3 C) 98.9 F (37.2 C) 100.2 F (37.9 C) 98.6 F (37 C)  TempSrc: Oral Oral Oral Oral  SpO2: 95% 93% 94% 95%  Weight:      Height:        Intake/Output Summary (Last 24 hours) at 12/13/2023 0726 Last data filed at 12/13/2023 0600 Gross per 24 hour  Intake 1668.4 ml  Output 500 ml  Net 1168.4 ml   Filed Weights   12/12/23 0055  Weight: 104.4 kg    Examination:  General:  Pleasantly resting in bed, mild distress in the setting of lower extremity pain HEENT:  Normocephalic atraumatic.  Sclerae nonicteric, noninjected.  Extraocular movements intact bilaterally. Neck:  Without mass or deformity.  Trachea is midline. Lungs:  Clear to auscultate bilaterally without rhonchi, wheeze, or rales. Heart:  Regular rate and rhythm.  Without murmurs, rubs, or gallops. Abdomen:  Soft, nontender, nondistended.  Without guarding or rebound. Extremities: Right lower extremity bandage clean dry intact; mild blanching erythema medially to the knee  Data Reviewed: I have personally reviewed following labs and imaging studies  CBC: Recent Labs  Lab 12/11/23 2002 12/12/23 0346 12/13/23 0548  WBC 6.5 9.5 8.1  NEUTROABS 3.9  --   --   HGB 13.2 13.7 12.9*  HCT 41.6 42.4 40.4  MCV 90.6 92.2 92.7  PLT 166 180 177   Basic Metabolic Panel: Recent Labs  Lab 12/11/23 2002 12/12/23 0346 12/13/23 0548  NA 139 138 138  K 3.9 4.2 3.8  CL 102 101 100  CO2 23 23 25   GLUCOSE 105* 106* 127*  BUN 13 14 12   CREATININE 0.73 0.81 0.73  CALCIUM  9.2 8.9 9.0   GFR: Estimated Creatinine Clearance: 125.6 mL/min (by C-G formula based on SCr of 0.73 mg/dL). Liver Function Tests: Recent Labs  Lab 12/12/23 0346  AST 24  ALT 18  ALKPHOS 96  BILITOT 0.4  PROT 7.2   ALBUMIN 3.9   Cardiac Enzymes: Recent Labs  Lab 12/12/23 0343  CKTOTAL 147   Sepsis Labs: Recent Labs  Lab 12/11/23 2002  LATICACIDVEN 1.3    Recent Results (from the past 240 hours)  Blood culture (routine x 2)     Status: None (Preliminary result)   Collection Time: 12/11/23  9:18 PM   Specimen: BLOOD  Result Value Ref Range Status   Specimen Description   Final    BLOOD RIGHT ANTECUBITAL Performed at [facility], [address] [place], [place] [address]    Special Requests   Final    Blood Culture adequate volume BOTTLES DRAWN AEROBIC AND ANAEROBIC Performed at [facility], [address], [place], [place] [address]    Culture   Final    NO GROWTH < 24 HOURS Performed at [facility], [address] [place], [place] [address]    Report Status PENDING  Incomplete  Aerobic/Anaerobic Culture w Gram Stain (surgical/deep wound)     Status: None (Preliminary result)   Collection Time: 12/11/23 10:53 PM   Specimen: Soft Tissue, Other  Result Value Ref Range Status   Specimen Description   Final    TISSUE Performed at [facility], [address] [place], [place] [address]    Special Requests   Final    RIGHT KNEE Performed at [facility], [address] [place], [place] [address]    Gram Stain   Final    NO WBC SEEN NO ORGANISMS SEEN Performed at [facility], [address], [place], [place] [address]    Culture PENDING  Incomplete   Report Status PENDING  Incomplete  Aerobic/Anaerobic Culture w Gram Stain (surgical/deep wound)     Status: None (Preliminary result)   Collection Time: 12/11/23 10:54 PM   Specimen: Soft Tissue, Other  Result Value Ref Range Status   Specimen Description   Final    TISSUE Performed at [facility], [address] [place], [place] [address]    Special Requests   Final    RIGHT KNEE Performed at [facility], [address], [place], [place] [address]    Gram Stain   Final    NO WBC SEEN NO ORGANISMS SEEN Performed at [facility], [address] [place], [place] [address]    Culture PENDING  Incomplete   Report Status PENDING  Incomplete  Aerobic/Anaerobic Culture w Gram Stain (surgical/deep wound)     Status: None (Preliminary result)   Collection Time: 12/11/23 10:54 PM   Specimen: Soft Tissue, Other  Result Value Ref Range Status   Specimen Description   Final    TISSUE Performed at [facility], [address], [place], [place] [address]    Special Requests   Final    RIGHT KNEE Performed at  [facility], [address], [place], [place] [address]    Gram Stain   Final    NO WBC SEEN NO ORGANISMS SEEN Performed at [facility], [address], [place], [place] [address]    Culture PENDING  Incomplete   Report Status PENDING  Incomplete  Aerobic/Anaerobic Culture w Gram Stain (surgical/deep wound)     Status: None (Preliminary result)   Collection Time: 12/11/23 11:00 PM   Specimen: Wound; Body Fluid  Result Value Ref Range Status   Specimen Description   Final    WOUND Performed at [facility], [address] [place], [place] [address]    Special Requests   Final    RIGHT KNEE Performed at [facility], [address], [place], [place] [address]    Gram Stain   Final    RARE WBC PRESENT, PREDOMINANTLY PMN NO ORGANISMS SEEN Performed at [facility], [address] [place], [place] [address]    Culture PENDING  Incomplete   Report Status PENDING  Incomplete  Blood culture (routine x 2)     Status: None (Preliminary result)   Collection Time: 12/12/23  3:46 AM   Specimen: BLOOD LEFT HAND  Result Value Ref Range Status   Specimen Description   Final    BLOOD LEFT HAND Performed at [facility], [address], [place], [place] [address]     Special Requests   Final    Blood Culture adequate volume BOTTLES DRAWN AEROBIC AND ANAEROBIC Performed at [facility], [address] [place], [place] [address]    Culture   Final    NO GROWTH < 12 HOURS Performed at [facility], [address], [place], [place] [address]    Report Status PENDING  Incomplete   Radiology Studies: DG Knee Complete 4 Views Right Result Date: 12/11/2023 CLINICAL DATA:  pain EXAM: RIGHT KNEE - COMPLETE 4+ VIEW COMPARISON:  None Available. FINDINGS: No evidence of fracture, dislocation. Joint effusion. No evidence of arthropathy or other focal bone abnormality. Soft tissues are unremarkable. IMPRESSION: 1. No acute displaced fracture or dislocation. 2. Joint effusion. Electronically Signed   By: Morgane  Naveau M.D.   On: 12/11/2023 21:49   Scheduled Meds:  aspirin  EC  81 mg Oral BID   carbamazepine   200 mg Oral TID   metoprolol  succinate  50 mg Oral Daily   rosuvastatin   40 mg Oral Daily   topiramate   75 mg Oral Daily   Continuous Infusions:  cefTRIAXone  (ROCEPHIN )  IV 2 g (12/13/23 0139)   DAPTOmycin  700 mg (12/12/23 1408)     LOS: 2 days   Time spent:  Elsie JAYSON Montclair, DO [facility]  If 7PM-7AM, please contact night-coverage [email or web address]  12/13/2023, 7:26 AM

## 2023-12-13 NOTE — Consult Note (Signed)
 [facility]                                                                                        Patient Identification: Patient Name: Marc Liu MRN: [id number] Admit Date: 12/11/2023  1:39 PM Today's Date: 12/13/2023 Reason for consult: septic arthritis  Requesting provider: Dr Lue  Principal Problem:   Septic arthritis [facility]) Active Problems:   Essential hypertension   HLD (hyperlipidemia)   Migraine  Antibiotics:  Vancomycin  9/17-9/18 Daptomycin  9/18- Ceftriaxone  9/17-  Lines/Hardware:  Assessment # Right knee septic arthritis - Ortho office cx pending, no organisms in gram stain per Dr Barton  - 9/17 right knee I&D with extensive synovectomy and synovial biopsies. 30 cc of frankly purulent fluid was aspirated from within the knee joint and sent for routine analysis and cultures. copious frank purulence   # Low grade fevers   Recommendations  - Fu blood cx. OR cultures. Ortho to notify if any positive cultures from office. D/w Ortho team - Continue daptomycin , ceftriaxone  - Monitor CBC, CMP and CPK - Needs blood culture to be negative for at least 48 hours/afebrile prior to PICC line placement - Postop care per orthopedics - Universal/standard isolation precaution Following peripherally over the weekend.   Rest of the management as per the primary team. Please call with questions or concerns.  Thank you for the consult  __________________________________________________________________________________________________________ HPI and Hospital Course: 48 year old male with prior history of BPD/Depression, chronic knee pain  who presented to the ED on 9/17 with right knee pain, fevers with concerns for septic arthritis.   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  Initially it was steroid injection but later  transition to gel injection.  He had a follow-up appointment with Ortho last Wednesday where he had gotten steroid injection instead of gel ( pending insurance approval as he was in pain). It was followed by pain the following day with increasing severity including fevers as well as swelling to the knee.  He was seen at orthopedics day before ED arrival where aspiration was done and was started on doxycycline on 9/16.  However he continued to have increasing pain, swelling, erythema in his right knee with radiation to the right leg.  No reported chest pain, shortness of breath, cough or nausea, vomiting or diarrhea or GU symptoms.  Denies prior surgeries or injuries or any insect bites in the right knee.  Denies IVDU  At ED afebrile on presentation Labs remarkable for CRP 4.6, ESR 35 unremarkable CBC and BMP HIV NR 9/17 and 9/18 blood cultures no growth to date  Xray rt knee  1. No acute displaced fracture or dislocation. 2. Joint effusion. Received IV morphine . Orthopedics consulted   ROS: General-  chills +, denies loss of appetite and loss of weight HEENT - Denies headache, blurry vision, neck pain, sinus pain Chest - Denies any chest pain, SOB or cough CVS- Denies any dizziness/lightheadedness, syncopal attacks, palpitations Abdomen- Denies any nausea, vomiting, abdominal pain, hematochezia and diarrhea Neuro - Denies any weakness, numbness, tingling sensation Psych - Denies any  changes in mood irritability or depressive symptoms GU- Denies any burning, dysuria, hematuria or increased frequency of urination Skin - denies any rashes/lesions MSK - rt knee pain   Past Medical History:  Diagnosis Date   Bipolar disorder (HCC)    Depression    Migraines    Shortness of breath dyspnea    Sinusitis    Past Surgical History:  Procedure Laterality Date   IRRIGATION AND DEBRIDEMENT KNEE Right 12/11/2023   Procedure: IRRIGATION AND DEBRIDEMENT KNEE;  Surgeon: Barton Drape, MD;   Location: [facility];  Service: Orthopedics;  Laterality: Right;   right lower leg tendons     Scheduled Meds:  aspirin  EC  81 mg Oral BID   carbamazepine   200 mg Oral TID   metoprolol  succinate  50 mg Oral Daily   rosuvastatin   40 mg Oral Daily   topiramate   75 mg Oral Daily   Continuous Infusions:  cefTRIAXone  (ROCEPHIN )  IV 2 g (12/13/23 0139)   DAPTOmycin  700 mg (12/12/23 1408)   PRN Meds:.acetaminophen  **OR** acetaminophen , alum & mag hydroxide-simeth, morphine  injection, oxyCODONE   No Known Allergies  Social History   Socioeconomic History   Marital status: Married    Spouse name: Not on file   Number of children: Not on file   Years of education: Not on file   Highest education level: Not on file  Occupational History   Not on file  Tobacco Use   Smoking status: Former    Current packs/day: 0.00    Average packs/day: 0.5 packs/day for 25.0 years (12.5 ttl pk-yrs)    Types: Cigarettes    Start date: 05/07/1989    Quit date: 05/07/2014    Years since quitting: 9.6   Smokeless tobacco: Never  Substance and Sexual Activity   Alcohol use: Yes    Comment: occ   Drug use: No   Sexual activity: Not on file  Other Topics Concern   Not on file  Social History Narrative   Not on file   Social Drivers of Health   Financial Resource Strain: Low Risk  (08/28/2023)   Received from [facility]   Overall Financial Resource Strain (CARDIA)    Difficulty of Paying Living Expenses: Not hard at all  Food Insecurity: No Food Insecurity (12/12/2023)   Hunger Vital Sign    Worried About Running Out of Food in the Last Year: Never true    Ran Out of Food in the Last Year: Never true  Transportation Needs: No Transportation Needs (12/12/2023)   PRAPARE - Administrator, Civil Service (Medical): No    Lack of Transportation (Non-Medical): No  Physical Activity: Unknown (03/27/2022)   Received from [facility]   Exercise Vital Sign    On average, how many days per week  do you engage in moderate to strenuous exercise (like a brisk walk)?: Patient declined    On average, how many minutes do you engage in exercise at this level?: 30 min  Stress: Patient Declined (03/27/2022)   Received from [facility] - Occupational Stress Questionnaire    Feeling of Stress : Patient declined  Social Connections: Socially Integrated (03/27/2022)   Received from [facility]   Social Network    How would you rate your social network (family, work, friends)?: Good participation with social networks  Intimate Partner Violence: Not At Risk (12/12/2023)   Humiliation, Afraid, Rape, and Kick questionnaire    Fear of Current or Ex-Partner:  No    Emotionally Abused: No    Physically Abused: No    Sexually Abused: No   Family History  Problem Relation Age of Onset   Cancer Mother    Cancer Father    Vitals BP (!) 169/100   Pulse 94   Temp 98.6 F (37 C) (Oral)   Resp 18   Ht 5' 6.25 (1.683 m)   Wt 104.4 kg   SpO2 95%   BMI 36.87 kg/m    Physical Exam Constitutional: Adult male lying in the bed, not in acute distress    Comments: HEENT WNL  Cardiovascular:     Rate and Rhythm: Normal rate and regular rhythm.     Heart sounds: S1 and S2  Pulmonary:     Effort: Pulmonary effort is normal.     Comments: Normal breath sounds  Abdominal:     Palpations: Abdomen is soft.     Tenderness: Nondistended and nontender  Musculoskeletal:        General: No swelling or tenderness in other peripheral joints  Right knee and leg is in postop bandage c/d/i  Skin:    Comments: No rashes  Neurological:     General: Awake alert and oriented grossly nonfocal  Psychiatric:        Mood and Affect: Mood normal.   Pertinent Microbiology Results for orders placed or performed during the hospital encounter of 12/11/23  Blood culture (routine x 2)     Status: None (Preliminary result)   Collection Time: 12/11/23  9:18 PM    Specimen: BLOOD  Result Value Ref Range Status   Specimen Description   Final    BLOOD RIGHT ANTECUBITAL Performed at [facility], [address] [place], [place] [address]    Special Requests   Final    Blood Culture adequate volume BOTTLES DRAWN AEROBIC AND ANAEROBIC Performed at [facility], [address] [place], [place] [address]    Culture   Final    NO GROWTH < 24 HOURS Performed at [facility], [address] [place], [place] [address]    Report Status PENDING  Incomplete  Aerobic/Anaerobic Culture w Gram Stain (surgical/deep wound)     Status: None (Preliminary result)   Collection Time: 12/11/23 10:53 PM   Specimen: Soft Tissue, Other  Result Value Ref Range Status   Specimen Description   Final    TISSUE Performed at [facility], [address] [place], [place] [address]    Special Requests   Final    RIGHT KNEE Performed at [facility], [address], [place], [place] [address]    Gram Stain   Final    NO WBC SEEN NO ORGANISMS SEEN Performed at [facility], [address] [place], [place] [address]    Culture PENDING  Incomplete   Report Status PENDING  Incomplete  Aerobic/Anaerobic Culture w Gram Stain (surgical/deep wound)     Status: None (Preliminary result)   Collection Time: 12/11/23 10:54 PM   Specimen: Soft Tissue, Other  Result Value Ref Range Status   Specimen Description   Final    TISSUE Performed at [facility], [address], [place], [place] [address]    Special Requests   Final    RIGHT KNEE Performed at [facility], [address] [place], [place] [address]    Gram Stain   Final    NO WBC SEEN NO  ORGANISMS SEEN Performed at [facility], [address] [place], [place] [address]    Culture PENDING  Incomplete   Report Status PENDING  Incomplete  Aerobic/Anaerobic Culture w Gram Stain  (surgical/deep wound)     Status: None (Preliminary result)   Collection Time: 12/11/23 10:54 PM   Specimen: Soft Tissue, Other  Result Value Ref Range Status   Specimen Description   Final    TISSUE Performed at [facility], [address], [place], [place] [address]    Special Requests   Final    RIGHT KNEE Performed at [facility], [address], [place], [place] [address]    Gram Stain   Final    NO WBC SEEN NO ORGANISMS SEEN Performed at [facility], [address] [place], [place] [address]    Culture PENDING  Incomplete   Report Status PENDING  Incomplete  Aerobic/Anaerobic Culture w Gram Stain (surgical/deep wound)     Status: None (Preliminary result)   Collection Time: 12/11/23 11:00 PM   Specimen: Wound; Body Fluid  Result Value Ref Range Status   Specimen Description   Final    WOUND Performed at [facility], [address] [place], [place] [address]    Special Requests   Final    RIGHT KNEE Performed at [facility], [address] [place], [place] [address]    Gram Stain   Final    RARE WBC PRESENT, PREDOMINANTLY PMN NO ORGANISMS SEEN Performed at [facility], [address] [place], [place] [address]    Culture PENDING  Incomplete   Report Status PENDING  Incomplete  Blood culture (routine x 2)     Status: None (Preliminary result)   Collection Time: 12/12/23  3:46 AM   Specimen: BLOOD LEFT HAND  Result Value Ref Range Status   Specimen Description   Final    BLOOD LEFT HAND Performed at [facility], [address] [place], [place] [address]    Special Requests   Final    Blood Culture adequate volume BOTTLES DRAWN AEROBIC AND ANAEROBIC Performed at [facility], [address] [place], [place] [address]    Culture   Final    NO GROWTH < 12 HOURS Performed at [facility], [address] [place], [place] [address]     Report Status PENDING  Incomplete   Pertinent Lab seen by me:    Latest Ref Rng & Units 12/13/2023    5:48 AM 12/12/2023    3:46 AM 12/11/2023    8:02 PM  CBC  WBC 4.0 - 10.5 K/uL 8.1  9.5  6.5   Hemoglobin 13.0 - 17.0 g/dL 87.0  86.2  86.7   Hematocrit 39.0 - 52.0 % 40.4  42.4  41.6   Platelets 150 - 400 K/uL 177  180  166       Latest Ref Rng & Units 12/13/2023    5:48 AM 12/12/2023    3:46 AM 12/11/2023    8:02 PM  CMP  Glucose 70 - 99 mg/dL 872  893  894   BUN 6 - 20 mg/dL 12  14  13    Creatinine 0.61 - 1.24 mg/dL 9.26  9.18  9.26   Sodium 135 - 145 mmol/L 138  138  139   Potassium 3.5 - 5.1 mmol/L 3.8  4.2  3.9   Chloride 98 - 111 mmol/L 100  101  102   CO2 22 - 32 mmol/L 25  23  23    Calcium  8.9 - 10.3 mg/dL 9.0  8.9  9.2   Total Protein 6.5 - 8.1 g/dL  7.2    Total Bilirubin 0.0 - 1.2 mg/dL  0.4    Alkaline Phos 38 - 126 U/L  96    AST 15 - 41 U/L  24    ALT 0 - 44 U/L  18      Pertinent Imagings/Other Imagings Plain films and CT images have been personally visualized and interpreted; radiology reports have been reviewed. Decision making incorporated into the Impression / Recommendations.  DG Knee Complete 4 Views Right Result Date: 12/11/2023 CLINICAL DATA:  pain EXAM: RIGHT KNEE - COMPLETE 4+ VIEW COMPARISON:  None Available. FINDINGS: No evidence of fracture, dislocation. Joint effusion. No evidence of arthropathy or other focal bone abnormality. Soft tissues are unremarkable. IMPRESSION: 1. No acute displaced fracture or dislocation. 2. Joint effusion. Electronically Signed   By: Morgane  Naveau M.D.   On: 12/11/2023 21:49    I spent 87 minutes involved in face-to-face and non-face-to-face activities for this patient on the day of the visit. Professional time spent includes the following activities: Preparing to see the patient (review of tests), Obtaining and reviewing separately obtained history (ED note, H&P, orthopedic notes, ER notes), Performing a medically  appropriate examination and evaluation , Ordering medications/labs, referring and communicating with other health care professionals including Dr. Barton and ID pharmacy, Documenting clinical information in the EMR, Independently interpreting results (not separately reported), Communicating results to the patient, Counseling and educating the patient and Care coordination (not separately reported).  Electronically signed by:   Plan d/w requesting provider as well as ID pharm D  Of note, portions of this note may have been created with voice recognition software. While this note has been edited for accuracy, occasional wrong-word or 'sound-a-like' substitutions may have occurred due to the inherent limitations of voice recognition software.   Annalee Orem, MD Infectious Disease Physician [facility] Pager: [phone]

## 2023-12-14 ENCOUNTER — Other Ambulatory Visit: Payer: Self-pay

## 2023-12-14 DIAGNOSIS — G43909 Migraine, unspecified, not intractable, without status migrainosus: Secondary | ICD-10-CM | POA: Diagnosis not present

## 2023-12-14 DIAGNOSIS — I10 Essential (primary) hypertension: Secondary | ICD-10-CM | POA: Diagnosis not present

## 2023-12-14 DIAGNOSIS — E785 Hyperlipidemia, unspecified: Secondary | ICD-10-CM | POA: Diagnosis not present

## 2023-12-14 DIAGNOSIS — M009 Pyogenic arthritis, unspecified: Secondary | ICD-10-CM | POA: Diagnosis not present

## 2023-12-14 MED ORDER — LABETALOL HCL 5 MG/ML IV SOLN
10.0000 mg | INTRAVENOUS | Status: DC | PRN
Start: 2023-12-14 — End: 2023-12-16
  Administered 2023-12-14: 10 mg via INTRAVENOUS
  Filled 2023-12-14 (×2): qty 4

## 2023-12-14 MED ORDER — CEFAZOLIN SODIUM-DEXTROSE 2-4 GM/100ML-% IV SOLN
2.0000 g | Freq: Three times a day (TID) | INTRAVENOUS | Status: DC
  Administered 2023-12-14 – 2023-12-16 (×6): 2 g via INTRAVENOUS
  Filled 2023-12-14 (×6): qty 100

## 2023-12-14 MED ORDER — SENNOSIDES-DOCUSATE SODIUM 8.6-50 MG PO TABS
1.0000 | ORAL_TABLET | Freq: Two times a day (BID) | ORAL | Status: DC
  Administered 2023-12-14 – 2023-12-15 (×4): 1 via ORAL
  Filled 2023-12-14 (×5): qty 1

## 2023-12-14 MED ORDER — POLYETHYLENE GLYCOL 3350 17 G PO PACK
17.0000 g | PACK | Freq: Two times a day (BID) | ORAL | Status: DC
  Administered 2023-12-14 – 2023-12-15 (×4): 17 g via ORAL
  Filled 2023-12-14 (×4): qty 1

## 2023-12-14 MED ORDER — HYDRALAZINE HCL 20 MG/ML IJ SOLN: 10.0000 mg | INTRAMUSCULAR | Status: DC | PRN

## 2023-12-14 NOTE — Progress Notes (Deleted)
 MEWS Progress Note  Patient Details Name: Marc Liu MRN: [id number] DOB: [birth year] Today's Date: 12/14/2023   MEWS Flowsheet Documentation:  Assess: MEWS Score Temp: (!) 100.9 F (38.3 C) BP: (!) 136/98 MAP (mmHg): 108 Pulse Rate: (!) 109 ECG Heart Rate: 78 Resp: 18 Level of Consciousness: Alert SpO2: 98 % O2 Device: Room Air O2 Flow Rate (L/min): 2 L/min Assess: MEWS Score MEWS Temp: 1 MEWS Systolic: 0 MEWS Pulse: 1 MEWS RR: 0 MEWS LOC: 0 MEWS Score: 2 MEWS Score Color: Yellow Assess: SIRS CRITERIA SIRS Temperature : 0 SIRS Respirations : 0 SIRS Pulse: 1 SIRS WBC: 0 SIRS Score Sum : 1 Assess: if the MEWS score is Yellow or Red Were vital signs accurate and taken at a resting state?: Yes Does the patient meet 2 or more of the SIRS criteria?: No MEWS guidelines implemented : No, previously yellow, continue vital signs every 4 hours Notify: Charge Nurse/RN Name of Charge Nurse/RN Notified: Interior and spatial designer, Magazine features editor Name/Title: C. Jonel, MD Date Provider Notified: 12/14/23 Time Provider Notified: 2000 Method of Notification: Page Notification Reason: Other (Comment) (Yellow MEWS) Provider response: No new orders Date of Provider Response: 12/14/23 Time of Provider Response: 2015 Notify: Rapid Response Name of Rapid Response RN Notified: N/A Date Rapid Response Notified:  (N/A) Time Rapid Response Notified:  (N/A)      [name] 12/14/2023, 8:23 PM

## 2023-12-14 NOTE — Progress Notes (Signed)
 ID brief note   T max 100.5  9/17 and 9/18 blood cx NGTD  9/17 OR cx with staph aureus in all 3 samples, ceftriaxone  was discontinued yesterday  Continue Daptomycin  Hold off on PICC pending maturation of blood cultures/febrile Monitor CBC, BMP and CPK Following peripherally over the weekend   Annalee Orem, MD Infectious Disease Physician [facility] [address] [place], [place] [address] Phone: [phone]  Fax: [phone]

## 2023-12-14 NOTE — Progress Notes (Signed)
   12/14/23 0035  Assess: MEWS Score  Temp (!) 100.5 F (38.1 C)  BP (!) 162/102  MAP (mmHg) 121  Pulse Rate (!) 111  Resp 17  SpO2 95 %  O2 Device Room Air  Assess: MEWS Score  MEWS Temp 1  MEWS Systolic 0  MEWS Pulse 2  MEWS RR 0  MEWS LOC 0  MEWS Score 3  MEWS Score Color Yellow  Assess: if the MEWS score is Yellow or Red  Were vital signs accurate and taken at a resting state? Yes  Does the patient meet 2 or more of the SIRS criteria? No  MEWS guidelines implemented  Yes, yellow  Treat  MEWS Interventions Considered administering scheduled or prn medications/treatments as ordered  Take Vital Signs  Increase Vital Sign Frequency  Yellow: Q2hr x1, continue Q4hrs until patient remains green for 12hrs  Escalate  MEWS: Escalate Yellow: Discuss with charge nurse and consider notifying provider and/or RRT  Notify: Charge Nurse/RN  Name of Charge Nurse/RN Notified [place], RN  Provider Notification  Provider Name/Title Alm Apo, MD  Date Provider Notified 12/14/23  Time Provider Notified [phone]  Method of Notification Page  Notification Reason Other (Comment) (yellow mews (high BP, tachycardia, low grade temp))  Provider response See new orders  Date of Provider Response 12/14/23  Time of Provider Response 0113  Notify: Rapid Response  Name of Rapid Response RN Notified N/A  Date Rapid Response Notified  (N/A)  Time Rapid Response Notified  (N/A)  Assess: SIRS CRITERIA  SIRS Temperature  0  SIRS Respirations  0  SIRS Pulse 1  SIRS WBC 0  SIRS Score Sum  1

## 2023-12-14 NOTE — Progress Notes (Signed)
 PROGRESS NOTE    Marc Liu  FMW:[id number] DOB: 1976-01-06 DOA: 12/11/2023 PCP: Radiontchenko, Alexei, MD   Brief Narrative:  Marc Liu is a 48 y.o. male with history of hypertension, hyperlipidemia, migraines presents to the ER because of worsening swelling of the right knee and pain - presented to the orthopedic office 9/16 when patient had arthrocentesis done labs were still pending was prescribed antibiotics with worsening symptoms - evaluated urgently in the ED - requiring I/D of R knee on 9/17.  Assessment & Plan:   Principal Problem:   Septic arthritis (HCC) Active Problems:   Essential hypertension   HLD (hyperlipidemia)   Migraine  Right knee septic arthritis s/p I/D on 9/17 - Appreciate orthopedic surgical consult, tolerated procedure well, pain well-controlled on current regimen including oxycodone  and morphine  -wean IV narcotics as appropriate - Cultures remain pending but are negative preliminarily, infectious disease consulted for further insight and recommendations (currently recommending daptomycin  and ceftriaxone )-plan for 6-week duration - PICC line placement per ID - Patient does not meet sepsis criteria - CRP elevated at 4.6; ESR 35; lactic negative  Hypertension continue home metoprolol  Hyperlipidemia continue rosuvastatin  Migraine continue carbamazepine  and Topamax  as prophylaxis  DVT prophylaxis: SCDs Start: 12/12/23 0052 Code Status:   Code Status: Full Code Family Communication: At bedside  Status is: Inpatient   Dispo: The patient is from: Home               Anticipated d/c is to: To be determined              Anticipated d/c date is: To be determined              Patient currently not medically stable for discharge  Consultants:  Orthopedic surgery Infectious disease  Procedures:  I&D right knee 12/11/2023  Antimicrobials:  Vancomycin , ceftriaxone   Subjective: Worsening pain overnight with notable hypertension and  minimally elevated temperature at 38.1, unclear if true fever versus secondary to uncontrolled pain.  Otherwise denies nausea vomiting diarrhea constipation headache fevers chills chest pain paresthesia, weakness distal to surgical site  Objective: Vitals:   12/14/23 0035 12/14/23 0219 12/14/23 0645 12/14/23 0950  BP: (!) 162/102 (!) 145/98 (!) 146/106 (!) 156/109  Pulse: (!) 111 87 86 91  Resp: 17 18 18    Temp: (!) 100.5 F (38.1 C) 98.6 F (37 C) 99.5 F (37.5 C) 98.1 F (36.7 C)  TempSrc: Oral Oral Oral Oral  SpO2: 95% 98% 99% 99%  Weight:      Height:        Intake/Output Summary (Last 24 hours) at 12/14/2023 1411 Last data filed at 12/14/2023 0600 Gross per 24 hour  Intake 820 ml  Output --  Net 820 ml   Filed Weights   12/12/23 0055  Weight: 104.4 kg    Examination:  General:  Pleasantly resting in bed, mild distress in the setting of lower extremity pain HEENT:  Normocephalic atraumatic.  Sclerae nonicteric, noninjected.  Extraocular movements intact bilaterally. Neck:  Without mass or deformity.  Trachea is midline. Lungs:  Clear to auscultate bilaterally without rhonchi, wheeze, or rales. Heart:  Regular rate and rhythm.  Without murmurs, rubs, or gallops. Abdomen:  Soft, nontender, nondistended.  Without guarding or rebound. Extremities: Right lower extremity bandage clean dry intact; mild blanching erythema medially to the knee  Data Reviewed: I have personally reviewed following labs and imaging studies  CBC: Recent Labs  Lab 12/11/23 2002 12/12/23 0346 12/13/23 0548  WBC 6.5 9.5  8.1  NEUTROABS 3.9  --   --   HGB 13.2 13.7 12.9*  HCT 41.6 42.4 40.4  MCV 90.6 92.2 92.7  PLT 166 180 177   Basic Metabolic Panel: Recent Labs  Lab 12/11/23 2002 12/12/23 0346 12/13/23 0548  NA 139 138 138  K 3.9 4.2 3.8  CL 102 101 100  CO2 23 23 25   GLUCOSE 105* 106* 127*  BUN 13 14 12   CREATININE 0.73 0.81 0.73  CALCIUM  9.2 8.9 9.0   GFR: Estimated  Creatinine Clearance: 125.6 mL/min (by C-G formula based on SCr of 0.73 mg/dL). Liver Function Tests: Recent Labs  Lab 12/12/23 0346  AST 24  ALT 18  ALKPHOS 96  BILITOT 0.4  PROT 7.2  ALBUMIN 3.9   Cardiac Enzymes: Recent Labs  Lab 12/12/23 0343  CKTOTAL 147   Sepsis Labs: Recent Labs  Lab 12/11/23 2002  LATICACIDVEN 1.3    Recent Results (from the past 240 hours)  Blood culture (routine x 2)     Status: None (Preliminary result)   Collection Time: 12/11/23  9:18 PM   Specimen: BLOOD  Result Value Ref Range Status   Specimen Description   Final    BLOOD RIGHT ANTECUBITAL Performed at [facility], [address], [place], [place] [address]    Special Requests   Final    Blood Culture adequate volume BOTTLES DRAWN AEROBIC AND ANAEROBIC Performed at [facility], [address] [place], [place] [address]    Culture   Final    NO GROWTH 3 DAYS Performed at [facility], [address], [place], [place] [address]    Report Status PENDING  Incomplete  Aerobic/Anaerobic Culture w Gram Stain (surgical/deep wound)     Status: None (Preliminary result)   Collection Time: 12/11/23 10:53 PM   Specimen: Soft Tissue, Other  Result Value Ref Range Status   Specimen Description   Final    TISSUE Performed at [facility], [address], [place], [place] [address]    Special Requests   Final    RIGHT KNEE Performed at [facility], [address], [place], [place] [address]    Gram Stain NO WBC SEEN NO ORGANISMS SEEN   Final   Culture   Final    RARE STAPHYLOCOCCUS AUREUS SUSCEPTIBILITIES PERFORMED ON PREVIOUS CULTURE WITHIN THE LAST 5 DAYS. Performed at [facility], [address], [place], [place] [address]    Report Status PENDING  Incomplete  Aerobic/Anaerobic Culture w Gram Stain (surgical/deep wound)     Status: None (Preliminary result)   Collection Time: 12/11/23 10:54  PM   Specimen: Soft Tissue, Other  Result Value Ref Range Status   Specimen Description   Final    TISSUE Performed at [facility], [address], [place], [place] [address]    Special Requests   Final    RIGHT KNEE Performed at [facility], [address] [place], [place] [address]    Gram Stain NO WBC SEEN NO ORGANISMS SEEN   Final   Culture   Final    FEW STAPHYLOCOCCUS AUREUS CRITICAL RESULT CALLED TO, READ BACK BY AND VERIFIED WITH: RN J.TAILER AT 1257 ON 12/13/2023 BY T.SAAD. Performed at [facility], [address], [place], [place] [address]    Report Status PENDING  Incomplete   Organism ID, Bacteria STAPHYLOCOCCUS AUREUS  Final      Susceptibility  Staphylococcus aureus - MIC*    CIPROFLOXACIN <=0.5 SENSITIVE Sensitive     ERYTHROMYCIN <=0.25 SENSITIVE Sensitive     GENTAMICIN <=0.5 SENSITIVE Sensitive     OXACILLIN 0.5 SENSITIVE Sensitive     TETRACYCLINE <=1 SENSITIVE Sensitive     VANCOMYCIN  1 SENSITIVE Sensitive     TRIMETH/SULFA <=10 SENSITIVE Sensitive     CLINDAMYCIN  <=0.25 SENSITIVE Sensitive     RIFAMPIN <=0.5 SENSITIVE Sensitive     Inducible Clindamycin  NEGATIVE Sensitive     LINEZOLID 2 SENSITIVE Sensitive     * FEW STAPHYLOCOCCUS AUREUS  Aerobic/Anaerobic Culture w Gram Stain (surgical/deep wound)     Status: None (Preliminary result)   Collection Time: 12/11/23 10:54 PM   Specimen: Soft Tissue, Other  Result Value Ref Range Status   Specimen Description   Final    TISSUE Performed at [facility], [address] [place], [place] [address]    Special Requests   Final    RIGHT KNEE Performed at [facility], [address], [place], [place] [address]    Gram Stain NO WBC SEEN NO ORGANISMS SEEN   Final   Culture   Final    RARE STAPHYLOCOCCUS AUREUS SUSCEPTIBILITIES PERFORMED ON PREVIOUS CULTURE WITHIN THE LAST 5 DAYS. Performed at [facility],  [address], [place], [place] [address]    Report Status PENDING  Incomplete  Aerobic/Anaerobic Culture w Gram Stain (surgical/deep wound)     Status: None (Preliminary result)   Collection Time: 12/11/23 11:00 PM   Specimen: Wound; Body Fluid  Result Value Ref Range Status   Specimen Description   Final    WOUND Performed at [facility], [address], [place], [place] [address]    Special Requests   Final    RIGHT KNEE Performed at [facility], [address] [place], [place] [address]    Gram Stain   Final    RARE WBC PRESENT, PREDOMINANTLY PMN NO ORGANISMS SEEN    Culture   Final    FEW STAPHYLOCOCCUS AUREUS SUSCEPTIBILITIES PERFORMED ON PREVIOUS CULTURE WITHIN THE LAST 5 DAYS. Performed at [facility], [address] [place], [place] [address]    Report Status PENDING  Incomplete  Blood culture (routine x 2)     Status: None (Preliminary result)   Collection Time: 12/12/23  3:46 AM   Specimen: BLOOD LEFT HAND  Result Value Ref Range Status   Specimen Description   Final    BLOOD LEFT HAND Performed at [facility], [address] [place], [place] [address]    Special Requests   Final    Blood Culture adequate volume BOTTLES DRAWN AEROBIC AND ANAEROBIC Performed at [facility], [address], [place], [place] [address]    Culture   Final    NO GROWTH 2 DAYS Performed at [facility], [address], [place], [place] [address]    Report Status PENDING  Incomplete   Radiology Studies: US  EKG SITE RITE Result Date: 12/14/2023 If Site Rite image not attached, placement could not be confirmed due to current cardiac rhythm.  Scheduled Meds:  aspirin  EC  81 mg Oral BID   carbamazepine   200 mg Oral TID   metoprolol  succinate  50 mg Oral Daily   polyethylene glycol  17 g Oral BID   rosuvastatin   40 mg Oral Daily   senna-docusate  1 tablet Oral BID  topiramate   75 mg Oral Daily    Continuous Infusions:  DAPTOmycin  700 mg (12/14/23 1407)     LOS: 3 days   Time spent:  Elsie JAYSON Montclair, DO [facility]  If 7PM-7AM, please contact night-coverage [email or web address]  12/14/2023, 2:11 PM

## 2023-12-14 NOTE — Plan of Care (Signed)
  Problem: Safety: Goal: Ability to remain free from injury will improve Outcome: Progressing   Problem: Pain Managment: Goal: General experience of comfort will improve and/or be controlled Outcome: Progressing   Problem: Elimination: Goal: Will not experience complications related to urinary retention Outcome: Progressing

## 2023-12-14 NOTE — Progress Notes (Signed)
 MEWS Progress Note  Patient Details Name: Marc Liu MRN: [id number] DOB: 05-02-1975 Today's Date: 12/14/2023   MEWS Flowsheet Documentation:  Assess: MEWS Score Temp: (!) 100.9 F (38.3 C) BP: (!) 136/98 MAP (mmHg): 108 Pulse Rate: (!) 109 ECG Heart Rate: 78 Resp: 18 Level of Consciousness: Alert SpO2: 98 % O2 Device: Room Air O2 Flow Rate (L/min): 2 L/min Assess: MEWS Score MEWS Temp: 1 MEWS Systolic: 0 MEWS Pulse: 1 MEWS RR: 0 MEWS LOC: 0 MEWS Score: 2 MEWS Score Color: Yellow Assess: SIRS CRITERIA SIRS Temperature : 0 SIRS Respirations : 0 SIRS Pulse: 1 SIRS WBC: 0 SIRS Score Sum : 1 Assess: if the MEWS score is Yellow or Red Were vital signs accurate and taken at a resting state?: Yes Does the patient meet 2 or more of the SIRS criteria?: No MEWS guidelines implemented : Yes, yellow Treat MEWS Interventions: Considered administering scheduled or prn medications/treatments as ordered Take Vital Signs Increase Vital Sign Frequency : Yellow: Q2hr x1, continue Q4hrs until patient remains green for 12hrs Escalate MEWS: Escalate: Yellow: Discuss with charge nurse and consider notifying provider and/or RRT Notify: Charge Nurse/RN Name of Charge Nurse/RN Notified: Beryl, Microbiologist Notification Provider Name/Title: C. Jonel, MD Date Provider Notified: 12/14/23 Time Provider Notified: 2000 Method of Notification: Page Notification Reason: Other (Comment) (Yellow MEWS) Provider response: No new orders Date of Provider Response: 12/14/23 Time of Provider Response: 2015 Notify: Rapid Response Name of Rapid Response RN Notified: N/A Date Rapid Response Notified:  (N/A) Time Rapid Response Notified:  (N/A)      Marc Liu 12/14/2023, 8:24 PM

## 2023-12-14 NOTE — Progress Notes (Signed)
 Subjective:  Marc Liu is a 48 y.o. male, 3 Days Post-Op    s/p Procedure(s): IRRIGATION AND DEBRIDEMENT KNEE   Patient reports pain as moderate.  Wife had concerns related to some redness on the inside of his knee, fevers, and high blood pressure. She also notes is O2 sat is going down at night. He has been working with PT, denies n/t. Does report fevers.   Objective:   VITALS:   Vitals:   12/14/23 0035 12/14/23 0219 12/14/23 0645 12/14/23 0950  BP: (!) 162/102 (!) 145/98 (!) 146/106 (!) 156/109  Pulse: (!) 111 87 86 91  Resp: 17 18 18    Temp: (!) 100.5 F (38.1 C) 98.6 F (37 C) 99.5 F (37.5 C) 98.1 F (36.7 C)  TempSrc: Oral Oral Oral Oral  SpO2: 95% 98% 99% 99%  Weight:      Height:       In hospital bed NAD.   RLE:   Neurovascular intact Sensation intact distally Intact pulses distally Dorsiflexion/Plantar flexion intact Incision: dressing C/D/I Compartment soft  Calf soft nontender, ROM 5 extension, 55 flexion . Mild erythema along the medial knee with some streaking toward the upper medial thigh.   Lab Results  Component Value Date   WBC 8.1 12/13/2023   HGB 12.9 (L) 12/13/2023   HCT 40.4 12/13/2023   MCV 92.7 12/13/2023   PLT 177 12/13/2023   BMET    Component Value Date/Time   NA 138 12/13/2023 0548   K 3.8 12/13/2023 0548   CL 100 12/13/2023 0548   CO2 25 12/13/2023 0548   GLUCOSE 127 (H) 12/13/2023 0548   BUN 12 12/13/2023 0548   CREATININE 0.73 12/13/2023 0548   CALCIUM  9.0 12/13/2023 0548   GFRNONAA >60 12/13/2023 0548     @[email or web address]   Assessment/Plan: 3 Days Post-Op   Principal Problem:   Septic arthritis (HCC) Active Problems:   Essential hypertension   HLD (hyperlipidemia)   Migraine   Advance diet Up with therapy  Discussed with wife based on exam and appearance today would not change the current course of treatment, s/p I&D on antibiotics per infectious disease.   Would recommend discussing BP  with hospitalist team as well as night time O2 sats which may be related to OSA We discussed pain and low grade fever is common with his condition and s/p surgery.   Weightbearing Status: WBAT DVT Prophylaxis: Aspirin  81mg   WBAT operative extremity, no active/passive knee flexion past 90 deg until wound healed -maintain Aquacel x 7 days postop, reinforce/replace as needed -trend intra-op cultures and path -trend CRP ESR and WBC -IV abx per primary team/ID recs -ID consult, anticipate at least 6 wk of outpatient IV antibiotic therapy given extensive infectious appearance intraop -DVT ppx: Aspirin  81 mg twice daily or per primary team -pain rx printed and given to his partner Nissim Fleischer (Oxycodone  5 mg x 40 tabs) -follow up as outpatient within 7-10 days for wound check -sutures & staples out in 2-3 weeks in outpatient office   ID Management:   ID brief note    T max 100.5   9/17 and 9/18 blood cx NGTD   9/17 OR cx with staph aureus in all 3 samples, ceftriaxone  was discontinued yesterday   Continue Daptomycin  Hold off on PICC pending maturation of blood cultures/febrile Monitor CBC, BMP and CPK Following peripherally over the weekend      Khamani Daniely D Harrold Fitchett 12/14/2023, 10:50 AM  Shakiara Lukic PA-C  Physician Assistant with  Dr. Sharl, [facility]

## 2023-12-14 NOTE — Progress Notes (Signed)
 Physical Therapy Treatment Patient Details Name: Marc Liu MRN: [id number] DOB: [birth year] Today's Date: 12/14/2023   History of Present Illness Marc Liu is a 48 y.o. male with history of hypertension, hyperlipidemia, migraines presents to the ER because of worsening swelling of the right knee and pain.  Patient states about a week ago patient had swelling of his right knee.  Denies any trauma or insect bites.  Had gone to orthopedic office where patient had steroid joint injection.  Following which patient's pain actually worsened and had presented to the orthopedic office yesterday when patient had arthrocentesis done labs were still pending was prescribed antibiotics.  Patient presents to the ER because of worsening pain and swelling.  Patient also had subjective feeling of fever chills at home.    PT Comments  Pt continues very cooperative and progressing well with mobility.  Pt up to ambulate increased distance in hall, negotiated stairs and with questions asked and answered.  Pt hopeful for dc home this date but dependent on Picc line placement and arrival of RW.  RN aware RW has not been delivered at this point.    If plan is discharge home, recommend the following: A little help with walking and/or transfers;A little help with bathing/dressing/bathroom;Assistance with cooking/housework;Assist for transportation;Help with stairs or ramp for entrance   Can travel by private vehicle        Equipment Recommendations  Rolling walker (2 wheels)    Recommendations for Other Services       Precautions / Restrictions Precautions Precautions: Fall;Other (comment) Recall of Precautions/Restrictions: Intact Precaution/Restrictions Comments: Pt reports Dr advises min flexion at R knee - limiting to ~ 30 degrees Restrictions Weight Bearing Restrictions Per Provider Order: Yes RLE Weight Bearing Per Provider Order: Weight bearing as tolerated     Mobility  Bed  Mobility Overal bed mobility: Needs Assistance Bed Mobility: Supine to Sit, Sit to Supine     Supine to sit: Supervision Sit to supine: Supervision   General bed mobility comments: Pt self assisting R LE with UEs; demonstrated use of gait belt as alternative assist    Transfers Overall transfer level: Needs assistance Equipment used: Rolling walker (2 wheels) Transfers: Sit to/from Stand Sit to Stand: Supervision           General transfer comment: min cues for LE management and use of  UEs to self assist    Ambulation/Gait Ambulation/Gait assistance: Contact guard assist, Supervision Gait Distance (Feet): 100 Feet Assistive device: Rolling walker (2 wheels) Gait Pattern/deviations: Step-to pattern, Decreased step length - right, Decreased step length - left, Shuffle, Trunk flexed Gait velocity: decr     General Gait Details: min cues for sequence, posture and position from RW   Stairs Stairs: Yes Stairs assistance: Contact guard assist Stair Management: No rails, Step to pattern, Backwards, Forwards, With walker Number of Stairs: 4 General stair comments: single step twice fwd and twice bkwd - spouse has provided picture of stairs which is essentially 7 single steps going up a hill side; spouse reports each step is at least 18 deep   Wheelchair Mobility     Tilt Bed    Modified Rankin (Stroke Patients Only)       Balance Overall balance assessment: Needs assistance Sitting-balance support: Feet supported, No upper extremity supported Sitting balance-Leahy Scale: Normal     Standing balance support: No upper extremity supported Standing balance-Leahy Scale: Fair  Communication Communication Communication: No apparent difficulties  Cognition Arousal: Alert Behavior During Therapy: WFL for tasks assessed/performed   PT - Cognitive impairments: No apparent impairments                          Following commands: Intact      Cueing Cueing Techniques: Verbal cues  Exercises General Exercises - Lower Extremity Ankle Circles/Pumps: AROM, Both, 15 reps, Supine Quad Sets: AROM, Right, 5 reps, Supine    General Comments        Pertinent Vitals/Pain Pain Assessment Pain Assessment: 0-10 Pain Score: 3  Pain Location: R knee with activity Pain Descriptors / Indicators: Aching, Sore Pain Intervention(s): Limited activity within patient's tolerance, Monitored during session, Premedicated before session, Ice applied    Home Living                          Prior Function            PT Goals (current goals can now be found in the care plan section) Acute Rehab PT Goals Patient Stated Goal: Regain IND PT Goal Formulation: With patient Time For Goal Achievement: 12/19/23 Potential to Achieve Goals: Good Progress towards PT goals: Progressing toward goals    Frequency    7X/week      PT Plan      Co-evaluation              AM-PAC PT 6 Clicks Mobility   Outcome Measure  Help needed turning from your back to your side while in a flat bed without using bedrails?: A Little Help needed moving from lying on your back to sitting on the side of a flat bed without using bedrails?: A Little Help needed moving to and from a bed to a chair (including a wheelchair)?: A Little Help needed standing up from a chair using your arms (e.g., wheelchair or bedside chair)?: A Little Help needed to walk in hospital room?: A Little Help needed climbing 3-5 steps with a railing? : A Little 6 Click Score: 18    End of Session Equipment Utilized During Treatment: Gait belt Activity Tolerance: Patient tolerated treatment well Patient left: in bed;with call bell/phone within reach;with family/visitor present Nurse Communication: Mobility status PT Visit Diagnosis: Difficulty in walking, not elsewhere classified (R26.2)     Time: 1310-1335 PT Time Calculation  (min) (ACUTE ONLY): 25 min  Charges:    $Gait Training: 8-22 mins $Therapeutic Activity: 8-22 mins PT General Charges $$ ACUTE PT VISIT: 1 Visit                     [facility] PT Acute Rehabilitation Services Office [phone]    Marialice Newkirk 12/14/2023, 4:20 PM

## 2023-12-15 DIAGNOSIS — R509 Fever, unspecified: Secondary | ICD-10-CM | POA: Diagnosis not present

## 2023-12-15 DIAGNOSIS — M00061 Staphylococcal arthritis, right knee: Secondary | ICD-10-CM

## 2023-12-15 DIAGNOSIS — B9561 Methicillin susceptible Staphylococcus aureus infection as the cause of diseases classified elsewhere: Secondary | ICD-10-CM

## 2023-12-15 LAB — CBC
HCT: 38.3 % — ABNORMAL LOW (ref 39.0–52.0)
Hemoglobin: 12.1 g/dL — ABNORMAL LOW (ref 13.0–17.0)
MCH: 28.9 pg (ref 26.0–34.0)
MCHC: 31.6 g/dL (ref 30.0–36.0)
MCV: 91.4 fL (ref 80.0–100.0)
Platelets: 187 K/uL (ref 150–400)
RBC: 4.19 MIL/uL — ABNORMAL LOW (ref 4.22–5.81)
RDW: 12.9 % (ref 11.5–15.5)
WBC: 6.1 K/uL (ref 4.0–10.5)
nRBC: 0 % (ref 0.0–0.2)

## 2023-12-15 LAB — BASIC METABOLIC PANEL WITH GFR
Anion gap: 13 (ref 5–15)
BUN: 15 mg/dL (ref 6–20)
CO2: 24 mmol/L (ref 22–32)
Calcium: 9.3 mg/dL (ref 8.9–10.3)
Chloride: 99 mmol/L (ref 98–111)
Creatinine, Ser: 0.78 mg/dL (ref 0.61–1.24)
GFR, Estimated: >60 mL/min (ref >60)
Glucose, Bld: 121 mg/dL — ABNORMAL HIGH (ref 70–99)
Potassium: 3.6 mmol/L (ref 3.5–5.1)
Sodium: 136 mmol/L (ref 135–145)

## 2023-12-15 NOTE — Progress Notes (Signed)
 [facility] Infectious Diseases Follow Up Note  Patient Identification: Patient Name: Marc Liu MRN: [id number] Admit Date: 12/11/2023  1:39 PM Age: 48 y.o.Today's Date: 12/15/2023   Reason for Visit: Follow-up on OR cultures, fevers  Principal Problem:   Septic arthritis [facility]) Active Problems:   Essential hypertension   HLD (hyperlipidemia)   Migraine  Antibiotics:  Cefazolin  9/20- Total days of antibiotics 5  Lines/Hardware:  Interval Events: Tmax 101.1 in the last 24 hours.  CBC and BMP unremarkable  Assessment 48 year old male with prior history of BPD/Depression, chronic knee pain with history of gel injection, most recently streoid injection who presented to the ED on 9/17 with right knee pain, fevers with concerns for septic arthritis.   # Right knee septic arthritis - Ortho office cx pending, no organisms in gram stain per Dr Barton  - 9/17 right knee I&D with extensive synovectomy and synovial biopsies. 30 cc of frankly purulent fluid was aspirated from within the knee joint and sent for routine analysis and cultures. copious frank purulence. OR cx MSSA in 3 samples    # Low grade fevers-likely secondary to above and expected to resolve.  No new signs of infection. - 9/17 and 9/18 blood cx NGTD  Recommendations - Continue IV cefazolin , plan for 6 weeks from 917 - Okay to place PICC tomorrow if stays afebrile - Monitor CBC BMP - Universal/standard isolation precaution D/w primary team  ID will so, recall back with questions or concerns  OPAT Diagnosis: Right knee septic arthritis, concern for osteomyelitis  Culture Result: MSSA  No Known Allergies  OPAT Orders Discharge antibiotics to be given via PICC line Discharge antibiotics: IV cefazolin  2 g IV every 8 Duration: 6 weeks End Date: 01/22/24  [facility] Care Per Protocol:  Home health RN for IV administration and teaching; PICC line care  and labs.    Labs weekly while on IV antibiotics: X__ CBC with differential X__ BMP __ CMP X__ CRP X__ ESR __ Vancomycin  trough __ CK  X__ Please pull PIC at completion of IV antibiotics __ Please leave PIC in place until doctor has seen patient or been notified  Fax weekly labs to [phone]  Clinic Follow Up Appt: 10/3 at 10:15 am   Rest of the management as per the primary team. Thank you for the consult. Please page with pertinent questions or concerns.  ______________________________________________________________________ Subjective patient seen and examined at the bedside.  Wife at bedside.  Reports she did not notice fever.  No other complaints  Vitals BP (!) 129/99 (BP Location: Right Arm)   Pulse 78   Temp 98.1 F (36.7 C)   Resp 20   Ht 5' 6.25 (1.683 m)   Wt 104.4 kg   SpO2 97%   BMI 36.87 kg/m      Physical Exam Constitutional: Adult male sitting in the bed, comfortable    Comments:   Cardiovascular:     Rate and Rhythm: Normal rate and regular rhythm.     Heart sounds:   Pulmonary:     Effort: Pulmonary effort is normal.     Comments:   Abdominal:     Palpations: Abdomen is nondistended    Tenderness:   Musculoskeletal:        General: No swelling or tenderness in peripheral joints Right knee with a Aquacel bandage C/D/I  Skin:    Comments: No rashes  Neurological:     General: Awake, alert and oriented, grossly nonfocal  Psychiatric:  Mood and Affect: Mood normal.   Pertinent Microbiology Results for orders placed or performed during the hospital encounter of 12/11/23  Blood culture (routine x 2)     Status: None (Preliminary result)   Collection Time: 12/11/23  9:18 PM   Specimen: BLOOD  Result Value Ref Range Status   Specimen Description   Final    BLOOD RIGHT ANTECUBITAL Performed at [facility], [address] [place], [place] [address]    Special Requests   Final    Blood Culture  adequate volume BOTTLES DRAWN AEROBIC AND ANAEROBIC Performed at [facility], [address] [place], [place] [address]    Culture   Final    NO GROWTH 4 DAYS Performed at [facility], [address], [place], [place] [address]    Report Status PENDING  Incomplete  Aerobic/Anaerobic Culture w Gram Stain (surgical/deep wound)     Status: None (Preliminary result)   Collection Time: 12/11/23 10:53 PM   Specimen: Soft Tissue, Other  Result Value Ref Range Status   Specimen Description   Final    TISSUE Performed at [facility], [address], [place], [place] [address]    Special Requests   Final    RIGHT KNEE Performed at [facility], [address], [place], [place] [address]    Gram Stain   Final    NO WBC SEEN NO ORGANISMS SEEN Performed at [facility], [address], [place], [place] [address]    Culture   Final    RARE STAPHYLOCOCCUS AUREUS SUSCEPTIBILITIES PERFORMED ON PREVIOUS CULTURE WITHIN THE LAST 5 DAYS. NO ANAEROBES ISOLATED; CULTURE IN PROGRESS FOR 5 DAYS    Report Status PENDING  Incomplete  Aerobic/Anaerobic Culture w Gram Stain (surgical/deep wound)     Status: None (Preliminary result)   Collection Time: 12/11/23 10:54 PM   Specimen: Soft Tissue, Other  Result Value Ref Range Status   Specimen Description   Final    TISSUE Performed at [facility], [address] [place], [place] [address]    Special Requests   Final    RIGHT KNEE Performed at [facility], [address], [place], [place] [address]    Gram Stain   Final    NO WBC SEEN NO ORGANISMS SEEN Performed at [facility], [address] [place], [place] [address]    Culture   Final    FEW STAPHYLOCOCCUS AUREUS CRITICAL RESULT CALLED TO, READ BACK BY AND VERIFIED WITH: RN J.TAILER AT 1257 ON 12/13/2023 BY T.SAAD. NO ANAEROBES ISOLATED; CULTURE IN PROGRESS FOR 5 DAYS     Report Status PENDING  Incomplete   Organism ID, Bacteria STAPHYLOCOCCUS AUREUS  Final      Susceptibility   Staphylococcus aureus - MIC*    CIPROFLOXACIN <=0.5 SENSITIVE Sensitive     ERYTHROMYCIN <=0.25 SENSITIVE Sensitive     GENTAMICIN <=0.5 SENSITIVE Sensitive     OXACILLIN 0.5 SENSITIVE Sensitive     TETRACYCLINE <=1 SENSITIVE Sensitive     VANCOMYCIN  1 SENSITIVE Sensitive     TRIMETH/SULFA <=10 SENSITIVE Sensitive     CLINDAMYCIN  <=0.25 SENSITIVE Sensitive     RIFAMPIN <=0.5 SENSITIVE Sensitive     Inducible Clindamycin  NEGATIVE Sensitive     LINEZOLID 2 SENSITIVE Sensitive     * FEW STAPHYLOCOCCUS AUREUS  Aerobic/Anaerobic Culture w Gram Stain (surgical/deep wound)     Status: None (Preliminary result)  Collection Time: 12/11/23 10:54 PM   Specimen: Soft Tissue, Other  Result Value Ref Range Status   Specimen Description   Final    TISSUE Performed at [facility], [address], [place], [place] [address]    Special Requests   Final    RIGHT KNEE Performed at [facility], [address], [place], [place] [address]    Gram Stain   Final    NO WBC SEEN NO ORGANISMS SEEN Performed at [facility], [address], [place], [place] [address]    Culture   Final    RARE STAPHYLOCOCCUS AUREUS SUSCEPTIBILITIES PERFORMED ON PREVIOUS CULTURE WITHIN THE LAST 5 DAYS. NO ANAEROBES ISOLATED; CULTURE IN PROGRESS FOR 5 DAYS    Report Status PENDING  Incomplete  Aerobic/Anaerobic Culture w Gram Stain (surgical/deep wound)     Status: None (Preliminary result)   Collection Time: 12/11/23 11:00 PM   Specimen: Wound; Body Fluid  Result Value Ref Range Status   Specimen Description   Final    WOUND Performed at [facility], [address], [place], [place] [address]    Special Requests   Final    RIGHT KNEE Performed at [facility], [address] [place], [place] [address]    Gram Stain    Final    RARE WBC PRESENT, PREDOMINANTLY PMN NO ORGANISMS SEEN Performed at [facility], [address] [place], [place] [address]    Culture   Final    FEW STAPHYLOCOCCUS AUREUS SUSCEPTIBILITIES PERFORMED ON PREVIOUS CULTURE WITHIN THE LAST 5 DAYS. NO ANAEROBES ISOLATED; CULTURE IN PROGRESS FOR 5 DAYS    Report Status PENDING  Incomplete  Blood culture (routine x 2)     Status: None (Preliminary result)   Collection Time: 12/12/23  3:46 AM   Specimen: BLOOD LEFT HAND  Result Value Ref Range Status   Specimen Description   Final    BLOOD LEFT HAND Performed at [facility], [address] [place], [place] [address]    Special Requests   Final    Blood Culture adequate volume BOTTLES DRAWN AEROBIC AND ANAEROBIC Performed at [facility] [address] [place], [place] [address]    Culture   Final    NO GROWTH 3 DAYS Performed at [facility], [address] [place], [place] [address]    Report Status PENDING  Incomplete   Pertinent Lab.    Latest Ref Rng & Units 12/15/2023    5:21 AM 12/13/2023    5:48 AM 12/12/2023    3:46 AM  CBC  WBC 4.0 - 10.5 K/uL 6.1  8.1  9.5   Hemoglobin 13.0 - 17.0 g/dL 87.8  87.0  86.2   Hematocrit 39.0 - 52.0 % 38.3  40.4  42.4   Platelets 150 - 400 K/uL 187  177  180       Latest Ref Rng & Units 12/15/2023    5:21 AM 12/13/2023    5:48 AM 12/12/2023    3:46 AM  CMP  Glucose 70 - 99 mg/dL 878  872  893   BUN 6 - 20 mg/dL 15  12  14    Creatinine 0.61 - 1.24 mg/dL 9.21  9.26  9.18   Sodium 135 - 145 mmol/L 136  138  138   Potassium 3.5 - 5.1 mmol/L 3.6  3.8  4.2   Chloride 98 - 111 mmol/L 99  100  101   CO2 22 - 32 mmol/L 24  25  23    Calcium  8.9 - 10.3 mg/dL 9.3  9.0  8.9   Total Protein 6.5 - 8.1 g/dL   7.2   Total Bilirubin 0.0 - 1.2 mg/dL   0.4   Alkaline Phos 38 - 126 U/L   96   AST 15 - 41 U/L   24   ALT 0 - 44 U/L   18    Pertinent Imaging today Plain films and CT images have been  personally visualized and interpreted; radiology reports have been reviewed. Decision making incorporated into the Impression /   US  EKG SITE RITE Result Date: 12/14/2023 If Site Rite image not attached, placement could not be confirmed due to current cardiac rhythm.  I spent 50 minutes involved in face-to-face and non-face-to-face activities for this patient on the day of the visit. Professional time spent includes the following activities: Preparing to see the patient (review of tests), Obtaining and reviewing separately obtained history (hospitalist progress note, orthopedic progress note), Performing a medically appropriate examination and evaluation , Ordering medications/labs, referring and communicating with other health care professionals including primary, Documenting clinical information in the EMR, Independently interpreting results (not separately reported), Communicating results to the patient/spouse, Counseling and educating the patient/spouse and Care coordination (not separately reported).   Plan d/w requesting provider as well as ID pharm D  Of note, portions of this note may have been created with voice recognition software. While this note has been edited for accuracy, occasional wrong-word or 'sound-a-like' substitutions may have occurred due to the inherent limitations of voice recognition software.   Electronically signed by:   Annalee Orem, MD Infectious Disease Physician [facility] Pager: [phone]

## 2023-12-15 NOTE — Progress Notes (Signed)
 Patient ID: Marc Liu, male   DOB: Sep 16, 1975, 48 y.o.   MRN: [id number] Subjective: 4 Days Post-Op Procedure(s) (LRB): IRRIGATION AND DEBRIDEMENT KNEE (Right)    Patient reports pain as mild. This morning we discussed concerns regarding some swelling in the medial aspect of the thigh. We are awaiting PICC line placement.  Objective:   VITALS:   Vitals:   12/15/23 0156 12/15/23 0613  BP: (!) 153/107 (!) 143/103  Pulse: 89 84  Resp: 18 18  Temp: 99.1 F (37.3 C) 98.5 F (36.9 C)  SpO2: 96% 97%    Neurovascular intact Incision: dressing C/D/I, the dressing over the right knee is dry without obvious drainage. Mild erythematous changes medially No significant or obvious fluctuance noted in the medial thigh  LABS Recent Labs    12/13/23 0548 12/15/23 0521  HGB 12.9* 12.1*  HCT 40.4 38.3*  WBC 8.1 6.1  PLT 177 187    Recent Labs    12/13/23 0548 12/15/23 0521  NA 138 136  K 3.8 3.6  BUN 12 15  CREATININE 0.73 0.78  GLUCOSE 127* 121*    No results for input(s): LABPT, INR in the last 72 hours.   Assessment/Plan: 4 Days Post-Op Procedure(s) (LRB): IRRIGATION AND DEBRIDEMENT KNEE (Right)   Up with therapy Continue ABX therapy due to diagnosis of native septic right knee following cortisone injection Culture growth consistent with MSSA currently on Ancef  Awaiting PICC line and duration of antibiotics through infectious disease recommendations prior to discharge likely tomorrow if PICC line placed.

## 2023-12-15 NOTE — Plan of Care (Signed)
   Problem: Health Behavior/Discharge Planning: Goal: Ability to manage health-related needs will improve Outcome: Progressing   Problem: Clinical Measurements: Goal: Ability to maintain clinical measurements within normal limits will improve Outcome: Progressing Goal: Will remain free from infection Outcome: Progressing Goal: Diagnostic test results will improve Outcome: Progressing Goal: Respiratory complications will improve Outcome: Progressing Goal: Cardiovascular complication will be avoided Outcome: Progressing   Problem: Activity: Goal: Risk for activity intolerance will decrease Outcome: Progressing   Problem: Nutrition: Goal: Adequate nutrition will be maintained Outcome: Progressing   Problem: Coping: Goal: Level of anxiety will decrease Outcome: Progressing   Problem: Elimination: Goal: Will not experience complications related to bowel motility Outcome: Progressing   Problem: Pain Managment: Goal: General experience of comfort will improve and/or be controlled Outcome: Progressing   Problem: Safety: Goal: Ability to remain free from injury will improve Outcome: Progressing   Problem: Skin Integrity: Goal: Risk for impaired skin integrity will decrease Outcome: Progressing

## 2023-12-15 NOTE — Progress Notes (Signed)
 Physical Therapy Treatment Patient Details Name: Marc Liu MRN: [id number] DOB: 02/05/1976 Today's Date: 12/15/2023   History of Present Illness Marc Liu is a 48 y.o. male with history of hypertension, hyperlipidemia, migraines presents to the ER because of worsening swelling of the right knee and pain.  Patient states about a week ago patient had swelling of his right knee.  Denies any trauma or insect bites.  Had gone to orthopedic office where patient had steroid joint injection.  Following which patient's pain actually worsened and had presented to the orthopedic office yesterday when patient had arthrocentesis done labs were still pending was prescribed antibiotics.  Patient presents to the ER because of worsening pain and swelling.  Patient also had subjective feeling of fever chills at home.    PT Comments  Pt motivated and progressing well with mobility and with noted improvement in R knee extension and WB tolerance.  Pt hopeful for Picc line and dc home tomorrow.    If plan is discharge home, recommend the following: A little help with bathing/dressing/bathroom;Assistance with cooking/housework;Assist for transportation;Help with stairs or ramp for entrance   Can travel by private vehicle        Equipment Recommendations  Rolling walker (2 wheels)    Recommendations for Other Services       Precautions / Restrictions Precautions Precautions: Fall;Other (comment) Recall of Precautions/Restrictions: Intact Precaution/Restrictions Comments: Pt reports Dr advises min flexion at R knee - limiting to ~ 30 degrees Restrictions Weight Bearing Restrictions Per Provider Order: Yes     Mobility  Bed Mobility Overal bed mobility: Modified Independent             General bed mobility comments: Pt Mod I with gait belt to exit bed    Transfers Overall transfer level: Needs assistance Equipment used: Rolling walker (2 wheels) Transfers: Sit to/from  Stand Sit to Stand: Supervision           General transfer comment: min cues for LE management, use of  UEs to self assist and safe transition position to sit in chair    Ambulation/Gait Ambulation/Gait assistance: Supervision, Modified independent (Device/Increase time) Gait Distance (Feet): 400 Feet Assistive device: Rolling walker (2 wheels) Gait Pattern/deviations: Step-to pattern, Step-through pattern, Decreased step length - right, Decreased step length - left, Shuffle, Trunk flexed       General Gait Details: min cues for sequence, posture and position from RW; progressed to   Praxair Mobility     Tilt Bed    Modified Rankin (Stroke Patients Only)       Balance Overall balance assessment: Needs assistance Sitting-balance support: Feet supported, No upper extremity supported Sitting balance-Leahy Scale: Normal Sitting balance - Comments: Steady reaching outside BOS   Standing balance support: No upper extremity supported Standing balance-Leahy Scale: Fair                              Hotel manager: No apparent difficulties  Cognition Arousal: Alert Behavior During Therapy: WFL for tasks assessed/performed   PT - Cognitive impairments: No apparent impairments                         Following commands: Intact      Cueing Cueing Techniques: Verbal cues  Exercises      General Comments  Pertinent Vitals/Pain Pain Assessment Pain Assessment: 0-10 Pain Score: 2  Pain Location: R knee with activity Pain Descriptors / Indicators: Aching, Sore Pain Intervention(s): Limited activity within patient's tolerance, Monitored during session, Ice applied    Home Living                          Prior Function            PT Goals (current goals can now be found in the care plan section) Acute Rehab PT Goals Patient Stated Goal: Regain IND PT Goal  Formulation: With patient Time For Goal Achievement: 12/19/23 Potential to Achieve Goals: Good Progress towards PT goals: Progressing toward goals    Frequency    7X/week      PT Plan      Co-evaluation              AM-PAC PT 6 Clicks Mobility   Outcome Measure  Help needed turning from your back to your side while in a flat bed without using bedrails?: A Little Help needed moving from lying on your back to sitting on the side of a flat bed without using bedrails?: None Help needed moving to and from a bed to a chair (including a wheelchair)?: A Little Help needed standing up from a chair using your arms (e.g., wheelchair or bedside chair)?: A Little Help needed to walk in hospital room?: A Little Help needed climbing 3-5 steps with a railing? : A Little 6 Click Score: 19    End of Session Equipment Utilized During Treatment: Gait belt Activity Tolerance: Patient tolerated treatment well Patient left: in chair;with call bell/phone within reach;with family/visitor present Nurse Communication: Mobility status PT Visit Diagnosis: Difficulty in walking, not elsewhere classified (R26.2)     Time: 8389-8367 PT Time Calculation (min) (ACUTE ONLY): 22 min  Charges:    $Gait Training: 8-22 mins PT General Charges $$ ACUTE PT VISIT: 1 Visit                     [facility] PT Acute Rehabilitation Services Office [phone]    [facility] 12/15/2023, 4:54 PM

## 2023-12-15 NOTE — Progress Notes (Signed)
 PROGRESS NOTE    Marc Liu  FMW:[id number] DOB: Nov 16, 1975 DOA: 12/11/2023 PCP: Radiontchenko, Alexei, MD   Brief Narrative:  Marc Liu is a 48 y.o. male with history of hypertension, hyperlipidemia, migraines presents to the ER because of worsening swelling of the right knee and pain - presented to the orthopedic office 9/16 when patient had arthrocentesis done labs were still pending was prescribed antibiotics with worsening symptoms - evaluated urgently in the ED - requiring I/D of R knee on 9/17.  Assessment & Plan:   Principal Problem:   Septic arthritis (HCC) Active Problems:   Essential hypertension   HLD (hyperlipidemia)   Migraine  Right knee septic arthritis s/p I/D on 9/17 - Appreciate orthopedic surgical consult, tolerated procedure well, pain well-controlled on current regimen including oxycodone  and morphine  -wean IV narcotics as appropriate - Cultures remain pending but are negative preliminarily, infectious disease consulted for further insight and recommendations (currently recommending daptomycin  and ceftriaxone )-plan for 6-week duration - PICC line placement per ID ok 9/22 if patient remains afebrile over the next 24 hours - Patient does not meet sepsis criteria - CRP elevated at 4.6; ESR 35; lactic negative  Hypertension continue home metoprolol  Hyperlipidemia continue rosuvastatin  Migraine continue carbamazepine  and Topamax  as prophylaxis  DVT prophylaxis: SCDs Start: 12/12/23 0052 Code Status:   Code Status: Full Code Family Communication: At bedside  Status is: Inpatient   Dispo: The patient is from: Home               Anticipated d/c is to: To be determined              Anticipated d/c date is: To be determined              Patient currently not medically stable for discharge  Consultants:  Orthopedic surgery Infectious disease  Procedures:  I&D right knee 12/11/2023  Antimicrobials:  Vancomycin ,  ceftriaxone   Subjective: Worsening pain overnight with notable hypertension and minimally elevated temperature at 38.1, unclear if true fever versus secondary to uncontrolled pain.  Otherwise denies nausea vomiting diarrhea constipation headache fevers chills chest pain paresthesia, weakness distal to surgical site  Objective: Vitals:   12/14/23 2000 12/14/23 2210 12/15/23 0156 12/15/23 0613  BP: (!) 136/98 (!) 142/94 (!) 153/107 (!) 143/103  Pulse: (!) 109 98 89 84  Resp:  16 18 18   Temp: (!) 100.9 F (38.3 C) 99.2 F (37.3 C) 99.1 F (37.3 C) 98.5 F (36.9 C)  TempSrc: Oral Oral    SpO2: 98% 95% 96% 97%  Weight:      Height:        Intake/Output Summary (Last 24 hours) at 12/15/2023 0749 Last data filed at 12/15/2023 0615 Gross per 24 hour  Intake 100 ml  Output --  Net 100 ml   Filed Weights   12/12/23 0055  Weight: 104.4 kg    Examination:  General:  Pleasantly resting in bed, mild distress in the setting of lower extremity pain HEENT:  Normocephalic atraumatic.  Sclerae nonicteric, noninjected.  Extraocular movements intact bilaterally. Neck:  Without mass or deformity.  Trachea is midline. Lungs:  Clear to auscultate bilaterally without rhonchi, wheeze, or rales. Heart:  Regular rate and rhythm.  Without murmurs, rubs, or gallops. Abdomen:  Soft, nontender, nondistended.  Without guarding or rebound. Extremities: Right lower extremity bandage clean dry intact; mild blanching erythema medially to the knee  Data Reviewed: I have personally reviewed following labs and imaging studies  CBC: Recent Labs  Lab 12/11/23 2002 12/12/23 0346 12/13/23 0548 12/15/23 0521  WBC 6.5 9.5 8.1 6.1  NEUTROABS 3.9  --   --   --   HGB 13.2 13.7 12.9* 12.1*  HCT 41.6 42.4 40.4 38.3*  MCV 90.6 92.2 92.7 91.4  PLT 166 180 177 187   Basic Metabolic Panel: Recent Labs  Lab 12/11/23 2002 12/12/23 0346 12/13/23 0548 12/15/23 0521  NA 139 138 138 136  K 3.9 4.2 3.8 3.6  CL  102 101 100 99  CO2 23 23 25 24   GLUCOSE 105* 106* 127* 121*  BUN 13 14 12 15   CREATININE 0.73 0.81 0.73 0.78  CALCIUM  9.2 8.9 9.0 9.3   GFR: Estimated Creatinine Clearance: 125.6 mL/min (by C-G formula based on SCr of 0.78 mg/dL). Liver Function Tests: Recent Labs  Lab 12/12/23 0346  AST 24  ALT 18  ALKPHOS 96  BILITOT 0.4  PROT 7.2  ALBUMIN 3.9   Cardiac Enzymes: Recent Labs  Lab 12/12/23 0343  CKTOTAL 147   Sepsis Labs: Recent Labs  Lab 12/11/23 2002  LATICACIDVEN 1.3    Recent Results (from the past 240 hours)  Blood culture (routine x 2)     Status: None (Preliminary result)   Collection Time: 12/11/23  9:18 PM   Specimen: BLOOD  Result Value Ref Range Status   Specimen Description   Final    BLOOD RIGHT ANTECUBITAL Performed at [facility], [address], [place], [place] [address]    Special Requests   Final    Blood Culture adequate volume BOTTLES DRAWN AEROBIC AND ANAEROBIC Performed at [facility], [address], [place], [place] [address]    Culture   Final    NO GROWTH 3 DAYS Performed at [facility], [address], [place], [place] [address]    Report Status PENDING  Incomplete  Aerobic/Anaerobic Culture w Gram Stain (surgical/deep wound)     Status: None (Preliminary result)   Collection Time: 12/11/23 10:53 PM   Specimen: Soft Tissue, Other  Result Value Ref Range Status   Specimen Description   Final    TISSUE Performed at [facility], [address], [place], [place] [address]    Special Requests   Final    RIGHT KNEE Performed at [facility], [address], [place], [place] [address]    Gram Stain   Final    NO WBC SEEN NO ORGANISMS SEEN Performed at [facility], [address] [place], [place] [address]    Culture   Final    RARE STAPHYLOCOCCUS AUREUS SUSCEPTIBILITIES PERFORMED ON PREVIOUS CULTURE WITHIN THE LAST 5 DAYS. NO  ANAEROBES ISOLATED; CULTURE IN PROGRESS FOR 5 DAYS    Report Status PENDING  Incomplete  Aerobic/Anaerobic Culture w Gram Stain (surgical/deep wound)     Status: None (Preliminary result)   Collection Time: 12/11/23 10:54 PM   Specimen: Soft Tissue, Other  Result Value Ref Range Status   Specimen Description   Final    TISSUE Performed at [facility], [address] [place], [place] [address]    Special Requests   Final    RIGHT KNEE Performed at [facility], [address], [place], [place] [address]    Gram Stain   Final    NO WBC SEEN NO ORGANISMS SEEN Performed at [facility], [address] [place], [place] [address]    Culture  Final    FEW STAPHYLOCOCCUS AUREUS CRITICAL RESULT CALLED TO, READ BACK BY AND VERIFIED WITH: RN J.TAILER AT 1257 ON 12/13/2023 BY T.SAAD. NO ANAEROBES ISOLATED; CULTURE IN PROGRESS FOR 5 DAYS    Report Status PENDING  Incomplete   Organism ID, Bacteria STAPHYLOCOCCUS AUREUS  Final      Susceptibility   Staphylococcus aureus - MIC*    CIPROFLOXACIN <=0.5 SENSITIVE Sensitive     ERYTHROMYCIN <=0.25 SENSITIVE Sensitive     GENTAMICIN <=0.5 SENSITIVE Sensitive     OXACILLIN 0.5 SENSITIVE Sensitive     TETRACYCLINE <=1 SENSITIVE Sensitive     VANCOMYCIN  1 SENSITIVE Sensitive     TRIMETH/SULFA <=10 SENSITIVE Sensitive     CLINDAMYCIN  <=0.25 SENSITIVE Sensitive     RIFAMPIN <=0.5 SENSITIVE Sensitive     Inducible Clindamycin  NEGATIVE Sensitive     LINEZOLID 2 SENSITIVE Sensitive     * FEW STAPHYLOCOCCUS AUREUS  Aerobic/Anaerobic Culture w Gram Stain (surgical/deep wound)     Status: None (Preliminary result)   Collection Time: 12/11/23 10:54 PM   Specimen: Soft Tissue, Other  Result Value Ref Range Status   Specimen Description   Final    TISSUE Performed at [facility], [address], [place], [place] [address]    Special Requests   Final    RIGHT KNEE Performed at [facility], [address], [place], [place] [address]    Gram Stain   Final    NO WBC SEEN NO ORGANISMS SEEN Performed at [facility], [address] [place], [place] [address]    Culture   Final    RARE STAPHYLOCOCCUS AUREUS SUSCEPTIBILITIES PERFORMED ON PREVIOUS CULTURE WITHIN THE LAST 5 DAYS. NO ANAEROBES ISOLATED; CULTURE IN PROGRESS FOR 5 DAYS    Report Status PENDING  Incomplete  Aerobic/Anaerobic Culture w Gram Stain (surgical/deep wound)     Status: None (Preliminary result)   Collection Time: 12/11/23 11:00 PM   Specimen: Wound; Body Fluid  Result Value Ref Range Status   Specimen Description   Final    WOUND Performed at [facility], [address] [place], [place] [address]    Special Requests   Final    RIGHT KNEE Performed at [facility], [address], [place], [place] [address]    Gram Stain   Final    RARE WBC PRESENT, PREDOMINANTLY PMN NO ORGANISMS SEEN Performed at [facility], [address] [place], [place] [address]    Culture   Final    FEW STAPHYLOCOCCUS AUREUS SUSCEPTIBILITIES PERFORMED ON PREVIOUS CULTURE WITHIN THE LAST 5 DAYS. NO ANAEROBES ISOLATED; CULTURE IN PROGRESS FOR 5 DAYS    Report Status PENDING  Incomplete  Blood culture (routine x 2)     Status: None (Preliminary result)   Collection Time: 12/12/23  3:46 AM   Specimen: BLOOD LEFT HAND  Result Value Ref Range Status   Specimen Description   Final    BLOOD LEFT HAND Performed at [facility], [address] [place], [place] [address]    Special Requests   Final    Blood Culture adequate volume BOTTLES DRAWN AEROBIC AND ANAEROBIC Performed at [facility], [address] [place], [place] [address]    Culture   Final    NO GROWTH 2 DAYS Performed at [facility], [address] [place], [place] [address]    Report Status PENDING  Incomplete  Radiology Studies: US  EKG SITE  RITE Result Date: 12/14/2023 If Site Rite image not attached, placement could not be confirmed due to current cardiac rhythm.  Scheduled Meds:  aspirin  EC  81 mg Oral BID   carbamazepine   200 mg Oral TID   metoprolol  succinate  50 mg Oral Daily   polyethylene glycol  17 g Oral BID   rosuvastatin   40 mg Oral Daily   senna-docusate  1 tablet Oral BID   topiramate   75 mg Oral Daily   Continuous Infusions:   ceFAZolin  (ANCEF ) IV 2 g (12/15/23 0615)     LOS: 4 days   Time spent:  Elsie JAYSON Montclair, DO [facility]  If 7PM-7AM, please contact night-coverage [email or web address]  12/15/2023, 7:49 AM

## 2023-12-16 ENCOUNTER — Other Ambulatory Visit: Payer: Self-pay

## 2023-12-16 DIAGNOSIS — M00061 Staphylococcal arthritis, right knee: Secondary | ICD-10-CM | POA: Diagnosis not present

## 2023-12-16 LAB — CULTURE, BLOOD (ROUTINE X 2)
Culture: NO GROWTH
Special Requests: ADEQUATE

## 2023-12-16 MED ORDER — ASPIRIN 81 MG PO TBEC: 81.0000 mg | DELAYED_RELEASE_TABLET | Freq: Two times a day (BID) | ORAL | 0 refills | Status: DC

## 2023-12-16 MED ORDER — OXYCODONE HCL 5 MG PO TABS: 5.0000 mg | ORAL_TABLET | Freq: Once | ORAL | Status: DC | PRN

## 2023-12-16 MED ORDER — SENNOSIDES-DOCUSATE SODIUM 8.6-50 MG PO TABS: 1.0000 | ORAL_TABLET | Freq: Two times a day (BID) | ORAL | 0 refills | Status: DC

## 2023-12-16 MED ORDER — CEFAZOLIN IV (FOR PTA / DISCHARGE USE ONLY): 2.0000 g | Freq: Three times a day (TID) | INTRAVENOUS | 0 refills | Status: AC

## 2023-12-16 MED ORDER — OXYCODONE HCL 5 MG PO TABS
5.0000 mg | ORAL_TABLET | Freq: Once | ORAL | Status: AC | PRN
  Administered 2023-12-16: 5 mg via ORAL
  Filled 2023-12-16: qty 1

## 2023-12-16 MED ORDER — HEPARIN SOD (PORK) LOCK FLUSH 100 UNIT/ML IV SOLN
250.0000 [IU] | INTRAVENOUS | Status: AC | PRN
  Administered 2023-12-16: 250 [IU]

## 2023-12-16 MED ORDER — OXYCODONE HCL 5 MG PO TABS
5.0000 mg | ORAL_TABLET | ORAL | Status: DC | PRN
  Administered 2023-12-16: 5 mg via ORAL
  Filled 2023-12-16: qty 1

## 2023-12-16 NOTE — Progress Notes (Signed)
 PHARMACY CONSULT NOTE FOR:  OUTPATIENT  PARENTERAL ANTIBIOTIC THERAPY (OPAT)  Indication: MSSA septic R-knee Regimen: Cefazolin  2g IV every 8 hours End date: 01/22/24  IV antibiotic discharge orders are pended. To discharging provider:  please sign these orders via discharge navigator,  Select New Orders & click on the button choice - Manage This Unsigned Work.     Thank you for allowing pharmacy to be a part of this patient's care.  Almarie Lunger, PharmD, BCPS, BCIDP Infectious Diseases Clinical Pharmacist 12/16/2023 7:57 AM   **Pharmacist phone directory can now be found on [email or web address] ([email or web address]).  Listed under [facility].

## 2023-12-16 NOTE — TOC Transition Note (Signed)
 Transition of Care [facility]) - Discharge Note   Patient Details  Name: Marc Liu MRN: [id number] Date of Birth: 1975-03-27  Transition of Care [facility]) CM/SW Contact:  Heather DELENA Saltness, LCSW Phone Number: 12/16/2023, 9:57 AM   Clinical Narrative:    Pt discharging home today with IV antibiotics. CSW confirmed Home IV Infusion and [facility] RN services through [facility] with [facility]. PT recommended RW to assist pt upon discharge. RW ordered through Jermaine at [place], to be delivered to bedside. Awaiting PICC line placement prior to discharge. No further TOC needs at this time.   Final next level of care: Home w Home Health Services Barriers to Discharge: Barriers Resolved   Patient Goals and CMS Choice Patient states their goals for this hospitalization and ongoing recovery are:: To return home        Discharge Placement  Home with IV antibiotics              Patient to be transferred to facility by: Family Name of family member notified: Patient Patient and family notified of of transfer: 12/16/23  Discharge Plan and Services Additional resources added to the After Visit Summary for  [facility] In-house Referral: Clinical Social Work              DME Arranged: Vannie rolling DME Agency: [employer] Date DME Agency Contacted: 12/16/23 Time DME Agency Contacted: [phone] Representative spoke with at DME Agency: [place] HH Arranged: RN HH Agency: [facility] Date HH Agency Contacted: 12/16/23 Time HH Agency Contacted: [phone] Representative spoke with at [facility] Agency: Holley Herring  Social Drivers of Health (SDOH) Interventions SDOH Screenings   Food Insecurity: No Food Insecurity (12/12/2023)  Housing: Low Risk  (12/12/2023)  Transportation Needs: No Transportation Needs (12/12/2023)  Utilities: Not At Risk (12/12/2023)  Financial Resource Strain: Low Risk  (08/28/2023)   Received from [facility]  Physical Activity: Unknown (03/27/2022)   Received from [facility]   Social Connections: Socially Integrated (03/27/2022)   Received from [facility]  Stress: Patient Declined (03/27/2022)   Received from [facility]  Tobacco Use: Medium Risk (12/12/2023)     Readmission Risk Interventions    12/12/2023   10:59 AM  Readmission Risk Prevention Plan  Post Dischage Appt Complete  Medication Screening Complete  Transportation Screening Complete     Signed: Heather Saltness, MSW, LCSW Clinical Social Worker Inpatient Care Management 12/16/2023 9:59 AM

## 2023-12-16 NOTE — Progress Notes (Signed)
 Peripherally Inserted Central Catheter Placement  The IV Nurse has discussed with the patient and/or persons authorized to consent for the patient, the purpose of this procedure and the potential benefits and risks involved with this procedure.  The benefits include less needle sticks, lab draws from the catheter, and the patient may be discharged home with the catheter. Risks include, but not limited to, infection, bleeding, blood clot (thrombus formation), and puncture of an artery; nerve damage and irregular heartbeat and possibility to perform a PICC exchange if needed/ordered by physician.  Alternatives to this procedure were also discussed.  Bard Power PICC patient education guide, fact sheet on infection prevention and patient information card has been provided to patient /or left at bedside.    PICC Placement Documentation  PICC Single Lumen 12/16/23 Right Brachial 37 cm 0 cm (Active)  Indication for Insertion or Continuance of Line Prolonged intravenous therapies 12/16/23 1157  Exposed Catheter (cm) 0 cm 12/16/23 1157  Site Assessment Clean, Dry, Intact 12/16/23 1157  Line Status Flushed;Blood return noted;Saline locked 12/16/23 1157  Dressing Type Transparent 12/16/23 1157  Dressing Status Antimicrobial disc/dressing in place 12/16/23 1157  Line Care Connections checked and tightened 12/16/23 1157  Line Adjustment (NICU/IV Team Only) No 12/16/23 1157  Dressing Intervention New dressing 12/16/23 1157  Dressing Change Due 12/23/23 12/16/23 1157       Marc Liu Dolores 12/16/2023, 11:59 AM

## 2023-12-16 NOTE — Progress Notes (Signed)
 Occupational Therapy Treatment and Discharge Patient Details Name: Marc Liu MRN: [id number] DOB: 05-27-1975 Today's Date: 12/16/2023   History of present illness Marc Liu is a 48 y.o. male with history of hypertension, hyperlipidemia, migraines presents to the ER because of worsening swelling of the right knee and pain.  Patient states about a week ago patient had swelling of his right knee.  Denies any trauma or insect bites.  Had gone to orthopedic office where patient had steroid joint injection.  Following which patient's pain actually worsened and had presented to the orthopedic office yesterday when patient had arthrocentesis done labs were still pending was prescribed antibiotics.  Patient presents to the ER because of worsening pain and swelling.  Patient also had subjective feeling of fever chills at home.   OT comments  Patient seen this am for Occupational Therapy with no further acute OT needs identified. All education has been completed and the patient and wife present for training and have no further questions.  DME and AD training this am with excellent teach back. See below for any follow-up Occupational Therapy or equipment needs. OT is signing off. Thank you for this referral.        If plan is discharge home, recommend the following:  A little help with walking and/or transfers;A little help with bathing/dressing/bathroom;Assist for transportation;Help with stairs or ramp for entrance   Equipment Recommendations  Other (comment) Adult nurse)       Precautions / Restrictions Precautions Precautions: Fall;Other (comment) Recall of Precautions/Restrictions: Intact Precaution/Restrictions Comments: Pt reports Dr advises min flexion at R knee - limiting to ~ 30 degrees Restrictions Weight Bearing Restrictions Per Provider Order: Yes RLE Weight Bearing Per Provider Order: Weight bearing as tolerated       Mobility Bed Mobility Overal bed  mobility: Modified Independent                  Transfers Overall transfer level: Needs assistance Equipment used: Rolling walker (2 wheels) Transfers: Sit to/from Stand, Bed to chair/wheelchair/BSC Sit to Stand: Supervision     Step pivot transfers: Supervision     General transfer comment: see above for amb and TTB training in ortho gym     Balance Overall balance assessment: Needs assistance Sitting-balance support: Feet supported, No upper extremity supported       Standing balance support: No upper extremity supported Standing balance-Leahy Scale: Fair Standing balance comment: PRN use of RW in static standing                           ADL either performed or assessed with clinical judgement   ADL Overall ADL's : Needs assistance/impaired                                 Tub/ Shower Transfer: Supervision/safety;Ambulation;Tub bench;Rolling walker (2 wheels) (gait belt) Tub/Shower Transfer Details (indicate cue type and reason): OT focus of sesison on needs for access to tub shower. Patient amb to and from hallway ortho gym with Supervision and RW and access to TTB with gait belt to maintain correct knee flexion maximum and patient and wife instructed on safety for in and out and water management. both aware it is up to MD if showers are allowed and when and both verbalized understanding Functional mobility during ADLs: Supervision/safety;Rolling walker (2 wheels) General ADL Comments: issued and trained in reacher use for  accessing LE items as well as A/IADL tems to prevent falls    Extremity/Trunk Assessment Upper Extremity Assessment Upper Extremity Assessment: Overall WFL for tasks assessed   Lower Extremity Assessment Lower Extremity Assessment: Defer to PT evaluation        Vision   Vision Assessment?: No apparent visual deficits         Communication Communication Communication: No apparent difficulties   Cognition  Arousal: Alert Behavior During Therapy: WFL for tasks assessed/performed Cognition: No apparent impairments                               Following commands: Intact        Cueing   Cueing Techniques: Verbal cues        General Comments R knee post op dressing intact and knee edema low    Pertinent Vitals/ Pain       Pain Assessment Pain Assessment: Faces Pain Score: 2  Pain Location: R knee with activity Pain Descriptors / Indicators: Aching, Sore Pain Intervention(s): Limited activity within patient's tolerance, Monitored during session, Premedicated before session, Repositioned, Ice applied   Frequency  Min 2X/week        Progress Toward Goals  OT Goals(current goals can now be found in the care plan section)  Progress towards OT goals: Goals met/education completed, patient discharged from OT  Acute Rehab OT Goals Patient Stated Goal: to get home soon OT Goal Formulation: With patient/family Time For Goal Achievement: 12/26/23 Potential to Achieve Goals: Good ADL Goals Pt Will Perform Grooming: with modified independence;standing Pt Will Perform Lower Body Dressing: with adaptive equipment;with modified independence;sit to/from stand Pt Will Transfer to Toilet: with modified independence;ambulating Pt Will Perform Toileting - Clothing Manipulation and hygiene: with modified independence;sit to/from stand  Plan         AM-PAC OT 6 Clicks Daily Activity     Outcome Measure   Help from another person eating meals?: None Help from another person taking care of personal grooming?: None Help from another person toileting, which includes using toliet, bedpan, or urinal?: A Little Help from another person bathing (including washing, rinsing, drying)?: A Little Help from another person to put on and taking off regular upper body clothing?: None Help from another person to put on and taking off regular lower body clothing?: A Little 6 Click Score:  21    End of Session Equipment Utilized During Treatment: Gait belt;Rolling walker (2 wheels)  OT Visit Diagnosis: Unsteadiness on feet (R26.81);Other abnormalities of gait and mobility (R26.89);Muscle weakness (generalized) (M62.81);Pain Pain - Right/Left: Left Pain - part of body: Knee   Activity Tolerance Patient tolerated treatment well   Patient Left in bed;with call bell/phone within reach;with family/visitor present   Nurse Communication Mobility status        Time: 9097-9069 OT Time Calculation (min): 28 min  Charges: OT General Charges $OT Visit: 1 Visit OT Treatments $Self Care/Home Management : 23-37 mins  Rainen Vanrossum OT/L Acute Rehabilitation Department  [phone]  12/16/2023, 10:24 AM

## 2023-12-16 NOTE — Discharge Summary (Signed)
 [facility]  Physician Discharge Summary   Patient ID: Marc Liu MRN: [id number] DOB/AGE: [birth year] 48 y.o.  Admit date: 12/11/2023 Discharge date: 12/16/2023    PCP: Nile Harvard, MD  DISCHARGE DIAGNOSES:  Right knee septic arthritis (HCC)   Essential hypertension   HLD (hyperlipidemia)   Migraine   RECOMMENDATIONS FOR OUTPATIENT FOLLOW UP: Outpatient follow-up with orthopedics as instructed by them   Home Health: RN Equipment/Devices: None  CODE STATUS: Full code  DISCHARGE CONDITION: fair  Diet recommendation: As before  INITIAL HISTORY: 48 y.o. male with history of hypertension, hyperlipidemia, migraines presents to the ER because of worsening swelling of the right knee and pain - presented to the orthopedic office 9/16 when patient had arthrocentesis done labs were still pending was prescribed antibiotics with worsening symptoms - evaluated urgently in the ED - requiring I/D of R knee on 9/17.   HOSPITAL COURSE:   Right knee septic arthritis s/p I/D on 9/17 Seen by orthopedics.  Underwent right knee irrigation and debridement with extensive synovectomy and synovial biopsies. Patient was also seen by infectious disease. Cultures positive for Staph aureus.  Patient will be discharged on IV cefazolin  for 6 weeks per infectious disease.  Has been afebrile for 48 hours.  PICC line has been placed.  Home health has been arranged for home IV antibiotics. Outpatient follow-up with orthopedics and ID as per the specialists.   Hypertension continue home metoprolol  Hyperlipidemia continue rosuvastatin  Migraine continue carbamazepine  and Topamax  as prophylaxis   Obesity Estimated body mass index is 36.87 kg/m as calculated from the following:   Height as of this encounter: 5' 6.25 (1.683 m).   Weight as of this encounter: 104.4 kg.  Patient is stable.  Looking forward to going home.  Okay for discharge today.  PERTINENT LABS:  The  results of significant diagnostics from this hospitalization (including imaging, microbiology, ancillary and laboratory) are listed below for reference.    Microbiology: Recent Results (from the past 240 hours)  Blood culture (routine x 2)     Status: None   Collection Time: 12/11/23  9:18 PM   Specimen: BLOOD  Result Value Ref Range Status   Specimen Description   Final    BLOOD RIGHT ANTECUBITAL Performed at [facility], [address] [place], [place] [address]    Special Requests   Final    Blood Culture adequate volume BOTTLES DRAWN AEROBIC AND ANAEROBIC Performed at [facility], [address] [place], [place] [address]    Culture   Final    NO GROWTH 5 DAYS Performed at [facility], [address] [place], [place] [address]    Report Status 12/16/2023 FINAL  Final  Aerobic/Anaerobic Culture w Gram Stain (surgical/deep wound)     Status: None (Preliminary result)   Collection Time: 12/11/23 10:53 PM   Specimen: Soft Tissue, Other  Result Value Ref Range Status   Specimen Description   Final    TISSUE Performed at [facility], [address], [place], [place] [address]    Special Requests   Final    RIGHT KNEE Performed at [facility], [address], [place], [place] [address]    Gram Stain   Final    NO WBC SEEN NO ORGANISMS SEEN Performed at [facility], [address], [place], [place] [address]    Culture   Final    RARE STAPHYLOCOCCUS AUREUS SUSCEPTIBILITIES PERFORMED ON PREVIOUS CULTURE WITHIN  THE LAST 5 DAYS. NO ANAEROBES ISOLATED; CULTURE IN PROGRESS FOR 5 DAYS    Report Status PENDING  Incomplete  Aerobic/Anaerobic Culture w Gram Stain (surgical/deep wound)     Status: None (Preliminary result)   Collection Time: 12/11/23 10:54 PM   Specimen: Soft Tissue, Other  Result Value Ref Range Status   Specimen Description   Final    TISSUE Performed at [facility], [address], [place], [place] [address]    Special Requests   Final    RIGHT KNEE Performed at [facility], [address], [place], [place] [address]    Gram Stain   Final    NO WBC SEEN NO ORGANISMS SEEN Performed at [facility], [address] [place], [place] [address]    Culture   Final    FEW STAPHYLOCOCCUS AUREUS CRITICAL RESULT CALLED TO, READ BACK BY AND VERIFIED WITH: RN J.TAILER AT 1257 ON 12/13/2023 BY T.SAAD. NO ANAEROBES ISOLATED; CULTURE IN PROGRESS FOR 5 DAYS    Report Status PENDING  Incomplete   Organism ID, Bacteria STAPHYLOCOCCUS AUREUS  Final      Susceptibility   Staphylococcus aureus - MIC*    CIPROFLOXACIN <=0.5 SENSITIVE Sensitive     ERYTHROMYCIN <=0.25 SENSITIVE Sensitive     GENTAMICIN <=0.5 SENSITIVE Sensitive     OXACILLIN 0.5 SENSITIVE Sensitive     TETRACYCLINE <=1 SENSITIVE Sensitive     VANCOMYCIN  1 SENSITIVE Sensitive     TRIMETH/SULFA <=10 SENSITIVE Sensitive     CLINDAMYCIN  <=0.25 SENSITIVE Sensitive     RIFAMPIN <=0.5 SENSITIVE Sensitive     Inducible Clindamycin  NEGATIVE Sensitive     LINEZOLID 2 SENSITIVE Sensitive     * FEW STAPHYLOCOCCUS AUREUS  Aerobic/Anaerobic Culture w Gram Stain (surgical/deep wound)     Status: None (Preliminary result)   Collection Time: 12/11/23 10:54 PM   Specimen: Soft Tissue, Other  Result Value Ref Range Status   Specimen Description   Final    TISSUE Performed at [facility], [address] [place], [place] [address]    Special Requests   Final    RIGHT KNEE Performed at [facility], [address], [place], [place] [address]    Gram Stain   Final    NO WBC SEEN NO ORGANISMS SEEN Performed at [facility], [address], [place], [place] [address]    Culture   Final    RARE STAPHYLOCOCCUS AUREUS SUSCEPTIBILITIES PERFORMED ON PREVIOUS CULTURE WITHIN THE LAST 5 DAYS. NO ANAEROBES ISOLATED; CULTURE  IN PROGRESS FOR 5 DAYS    Report Status PENDING  Incomplete  Aerobic/Anaerobic Culture w Gram Stain (surgical/deep wound)     Status: None (Preliminary result)   Collection Time: 12/11/23 11:00 PM   Specimen: Wound; Body Fluid  Result Value Ref Range Status   Specimen Description   Final    WOUND Performed at [facility], [address] [place], [place] [address]    Special Requests   Final    RIGHT KNEE Performed at [facility], [address] [place], [place] [address]    Gram Stain   Final    RARE WBC PRESENT, PREDOMINANTLY PMN NO ORGANISMS SEEN Performed at [facility], [address], [place], [place] [address]    Culture   Final    FEW STAPHYLOCOCCUS AUREUS SUSCEPTIBILITIES PERFORMED ON PREVIOUS CULTURE WITHIN THE LAST 5 DAYS. NO ANAEROBES ISOLATED;  CULTURE IN PROGRESS FOR 5 DAYS    Report Status PENDING  Incomplete  Blood culture (routine x 2)     Status: None   Collection Time: 12/12/23  3:46 AM   Specimen: BLOOD LEFT HAND  Result Value Ref Range Status   Specimen Description   Final    BLOOD LEFT HAND Performed at [facility], [address], [place], [place] [address]    Special Requests   Final    Blood Culture adequate volume BOTTLES DRAWN AEROBIC AND ANAEROBIC Performed at [facility], [address], [place], [place] [address]    Culture   Final    NO GROWTH 5 DAYS Performed at [facility], [address], [place], [place] [address]    Report Status 12/17/2023 FINAL  Final     Labs:   Basic Metabolic Panel: Recent Labs  Lab 12/11/23 2002 12/12/23 0346 12/13/23 0548 12/15/23 0521  NA 139 138 138 136  K 3.9 4.2 3.8 3.6  CL 102 101 100 99  CO2 23 23 25 24   GLUCOSE 105* 106* 127* 121*  BUN 13 14 12 15   CREATININE 0.73 0.81 0.73 0.78  CALCIUM  9.2 8.9 9.0 9.3   Liver Function Tests: Recent Labs  Lab 12/12/23 0346  AST 24  ALT 18  ALKPHOS 96  BILITOT 0.4   PROT 7.2  ALBUMIN 3.9    CBC: Recent Labs  Lab 12/11/23 2002 12/12/23 0346 12/13/23 0548 12/15/23 0521  WBC 6.5 9.5 8.1 6.1  NEUTROABS 3.9  --   --   --   HGB 13.2 13.7 12.9* 12.1*  HCT 41.6 42.4 40.4 38.3*  MCV 90.6 92.2 92.7 91.4  PLT 166 180 177 187   Cardiac Enzymes: Recent Labs  Lab 12/12/23 0343  CKTOTAL 147    IMAGING STUDIES US  EKG SITE RITE Result Date: 12/16/2023 If Site Rite image not attached, placement could not be confirmed due to current cardiac rhythm.  US  EKG SITE RITE Result Date: 12/14/2023 If Site Rite image not attached, placement could not be confirmed due to current cardiac rhythm.  DG Knee Complete 4 Views Right Result Date: 12/11/2023 CLINICAL DATA:  pain EXAM: RIGHT KNEE - COMPLETE 4+ VIEW COMPARISON:  None Available. FINDINGS: No evidence of fracture, dislocation. Joint effusion. No evidence of arthropathy or other focal bone abnormality. Soft tissues are unremarkable. IMPRESSION: 1. No acute displaced fracture or dislocation. 2. Joint effusion. Electronically Signed   By: Morgane  Naveau M.D.   On: 12/11/2023 21:49    DISCHARGE EXAMINATION: Vitals:   12/15/23 1326 12/15/23 2045 12/16/23 0525 12/16/23 0944  BP: (!) 138/93 (!) 145/98 (!) 147/97 (!) 147/104  Pulse: 88 90 85 88  Resp: 18 18 18    Temp: 98.3 F (36.8 C) 97.9 F (36.6 C) 97.8 F (36.6 C)   TempSrc:  Oral Oral   SpO2: 98% 98% 97%   Weight:      Height:       General appearance: Awake alert.  In no distress Resp: Clear to auscultation bilaterally.  Normal effort Cardio: S1-S2 is normal regular.  No S3-S4.  No rubs murmurs or bruit GI: Abdomen is soft.  Nontender nondistended.  Bowel sounds are present normal.  No masses organomegaly Right knee covered in dressing   DISPOSITION: Home  Discharge Instructions     [facility] pharmacist to adjust dose for Vancomycin , Aminoglycosides and other anti-infective therapies as requested by physician.   Complete by: As  directed    [facility] to provide Cath Flo 2mg    Complete by: As directed    Administer for PICC line occlusion and as ordered by physician for other access device issues.   Anaphylaxis Kit: Provided to treat any anaphylactic reaction to the medication being provided to the patient if First Dose or when requested by physician   Complete by: As directed    Epinephrine 1mg /ml vial / amp: Administer 0.3mg  (0.10ml) subcutaneously once for moderate to severe anaphylaxis, nurse to call physician and pharmacy when reaction occurs and call 911 if needed for immediate care   Diphenhydramine  50mg /ml IV vial: Administer 25-50mg  IV/IM PRN for first dose reaction, rash, itching, mild reaction, nurse to call physician and pharmacy when reaction occurs   Sodium Chloride  0.9% NS 500ml IV: Administer if needed for hypovolemic blood pressure drop or as ordered by physician after call to physician with anaphylactic reaction   Call MD for:  difficulty breathing, headache or visual disturbances   Complete by: As directed    Call MD for:  extreme fatigue   Complete by: As directed    Call MD for:  persistant dizziness or light-headedness   Complete by: As directed    Call MD for:  persistant nausea and vomiting   Complete by: As directed    Call MD for:  severe uncontrolled pain   Complete by: As directed    Call MD for:  temperature >100.4   Complete by: As directed    Change dressing on IV access line weekly and PRN   Complete by: As directed    Diet general   Complete by: As directed    Discharge instructions   Complete by: As directed    Please follow instructions provided by orthopedist.  You were cared for by a hospitalist during your hospital stay. If you have any questions about your discharge medications or the care you received while you were in the hospital after you are discharged, you can call the unit and asked to speak with the hospitalist on call if the hospitalist that took care  of you is not available. Once you are discharged, your primary care physician will handle any further medical issues. Please note that NO REFILLS for any discharge medications will be authorized once you are discharged, as it is imperative that you return to your primary care physician (or establish a relationship with a primary care physician if you do not have one) for your aftercare needs so that they can reassess your need for medications and monitor your lab values. If you do not have a primary care physician, you can call [phone] for a physician referral.   Discharge wound care:   Complete by: As directed    As instructed by orthopedist.   Flush IV access with Sodium Chloride  0.9% and Heparin  10 units/ml or 100 units/ml   Complete by: As directed    Home infusion instructions - [facility]   Complete by: As directed    Instructions: Flush IV access with Sodium Chloride  0.9% and Heparin  10units/ml or 100units/ml   Change dressing on IV access line: Weekly and PRN   Instructions Cath Flo 2mg : Administer for PICC Line occlusion and as ordered by physician for other access device   [facility] pharmacist to adjust dose for: Vancomycin , Aminoglycosides and other anti-infective therapies as requested by physician   Increase activity slowly   Complete by: As directed    Method of administration may  be changed at the discretion of home infusion pharmacist based upon assessment of the patient and/or caregiver's ability to self-administer the medication ordered   Complete by: As directed          Allergies as of 12/16/2023   No Known Allergies      Medication List     STOP taking these medications    doxycycline 100 MG capsule Commonly known as: VIBRAMYCIN   HYDROcodone-acetaminophen  5-325 MG tablet Commonly known as: NORCO/VICODIN       TAKE these medications    aspirin  EC 81 MG tablet Take 1 tablet (81 mg total) by mouth 2 (two) times daily. Swallow  whole.   carbamazepine  200 MG tablet Commonly known as: TEGRETOL  Take 200 mg by mouth 3 (three) times daily.   ceFAZolin  IVPB Commonly known as: ANCEF  Inject 2 g into the vein every 8 (eight) hours. Indication:  MSSA septic R-knee First Dose: Yes Last Day of Therapy:  01/22/24 Labs - Once weekly:  CBC/D and BMP, Labs - Once weekly: ESR and CRP Method of administration: IV Push Method of administration may be changed at the discretion of home infusion pharmacist based upon assessment of the patient and/or caregiver's ability to self-administer the medication ordered.   metoprolol  succinate 50 MG 24 hr tablet Commonly known as: TOPROL -XL Take 50 mg by mouth daily.   oxyCODONE  5 MG immediate release tablet Commonly known as: Roxicodone  Take 1 tablet (5 mg total) by mouth every 4 (four) hours as needed for up to 7 days for severe pain (pain score 7-10) (pain).   rizatriptan 10 MG tablet Commonly known as: MAXALT Take 1 tablet by mouth as needed. For migraine pain   rosuvastatin  40 MG tablet Commonly known as: CRESTOR  Take 40 mg by mouth daily.   senna-docusate 8.6-50 MG tablet Commonly known as: Senokot-S Take 1 tablet by mouth 2 (two) times daily.   topiramate  25 MG tablet Commonly known as: TOPAMAX  Take 25 mg by mouth in the morning, at noon, and at bedtime.               Discharge Care Instructions  (From admission, onward)           Start     Ordered   12/16/23 0000  Change dressing on IV access line weekly and PRN  (Home infusion instructions - [facility] )        12/16/23 0905   12/16/23 0000  Discharge wound care:       Comments: As instructed by orthopedist.   12/16/23 1025              Follow-up Information     [facility] Follow up.   Why: This provider will begin Home IV infusion for antibiotics upon discharge from the hospital.                TOTAL DISCHARGE TIME: 35 minutes  Luke Rigsbee [employer] on [email or web address]  12/17/2023, 10:37 AM

## 2023-12-17 LAB — CULTURE, BLOOD (ROUTINE X 2)
Culture: NO GROWTH
Special Requests: ADEQUATE

## 2023-12-17 LAB — AEROBIC/ANAEROBIC CULTURE W GRAM STAIN (SURGICAL/DEEP WOUND)
Gram Stain: NONE SEEN
Gram Stain: NONE SEEN
Gram Stain: NONE SEEN

## 2023-12-27 ENCOUNTER — Ambulatory Visit (INDEPENDENT_AMBULATORY_CARE_PROVIDER_SITE_OTHER): Admitting: Infectious Diseases

## 2023-12-27 ENCOUNTER — Other Ambulatory Visit: Payer: Self-pay

## 2023-12-27 ENCOUNTER — Encounter: Payer: Self-pay | Admitting: Infectious Diseases

## 2023-12-27 VITALS — BP 144/87 | HR 93 | Temp 98.1°F | Wt 215.0 lb

## 2023-12-27 DIAGNOSIS — Z452 Encounter for adjustment and management of vascular access device: Secondary | ICD-10-CM | POA: Insufficient documentation

## 2023-12-27 DIAGNOSIS — Z5181 Encounter for therapeutic drug level monitoring: Secondary | ICD-10-CM | POA: Diagnosis not present

## 2023-12-27 DIAGNOSIS — M00061 Staphylococcal arthritis, right knee: Secondary | ICD-10-CM

## 2023-12-27 NOTE — Progress Notes (Signed)
 Patient Active Problem List   Diagnosis Date Noted   Essential hypertension 12/12/2023   HLD (hyperlipidemia) 12/12/2023   Migraine 12/12/2023   Septic arthritis (HCC) 12/11/2023   Systolic murmur 02/14/2015   Viral pneumonia 02/13/2015   Acute respiratory failure with hypoxia (HCC) 02/13/2015   ABRASION, FINGER, INFECTED 11/02/2008    Patient's Medications  New Prescriptions   No medications on file  Previous Medications   ASPIRIN  EC 81 MG TABLET    Take 1 tablet (81 mg total) by mouth 2 (two) times daily. Swallow whole.   CARBAMAZEPINE  (TEGRETOL ) 200 MG TABLET    Take 200 mg by mouth 3 (three) times daily.   CEFAZOLIN  (ANCEF ) IVPB    Inject 2 g into the vein every 8 (eight) hours. Indication:  MSSA septic R-knee First Dose: Yes Last Day of Therapy:  01/22/24 Labs - Once weekly:  CBC/D and BMP, Labs - Once weekly: ESR and CRP Method of administration: IV Push Method of administration may be changed at the discretion of home infusion pharmacist based upon assessment of the patient and/or caregiver's ability to self-administer the medication ordered.   METOPROLOL  SUCCINATE (TOPROL -XL) 50 MG 24 HR TABLET    Take 50 mg by mouth daily.   RIZATRIPTAN (MAXALT) 10 MG TABLET    Take 1 tablet by mouth as needed. For migraine pain   ROSUVASTATIN  (CRESTOR ) 40 MG TABLET    Take 40 mg by mouth daily.   SENNA-DOCUSATE (SENOKOT-S) 8.6-50 MG TABLET    Take 1 tablet by mouth 2 (two) times daily.   TOPIRAMATE  (TOPAMAX ) 25 MG TABLET    Take 25 mg by mouth in the morning, at noon, and at bedtime.  Modified Medications   No medications on file  Discontinued Medications   No medications on file    Subjective: Discussed the use of AI scribe software for clinical note transcription with the patient, who gave verbal consent to proceed.   48 year old male with prior history of BPD/Depression, chronic knee pain with history of gel injection, most recently streoid injection who is here for HFU  after recent admission 9/17-9/22 for right knee septic arthritis.  Discharged on 9/22 to complete 6 weeks of IV cefazolin  through 10/29 via PICC.   10/3 Accompanied by family member.  Getting IV cefazolin  without any concerns related to the PICC or antibiotics.  Denies nausea, vomiting, rashes or diarrhea.  Walks with walker, some soreness in rt leg but no other concerns.  Has been seen by orthopedics after discharge from the hospital and half of the staples were taken out. Has a nurse visit on 10/9 to take remaining staples out and Ortho visit with doctor on 10/29. Doing well with no concerns  Review of Systems: all systems reviewed with pertinent positive and negative as listed above  Past Medical History:  Diagnosis Date   Bipolar disorder (HCC)    Depression    Migraines    Shortness of breath dyspnea    Sinusitis    Past Surgical History:  Procedure Laterality Date   IRRIGATION AND DEBRIDEMENT KNEE Right 12/11/2023   Procedure: IRRIGATION AND DEBRIDEMENT KNEE;  Surgeon: Barton Drape, MD;  Location: [facility] ORS;  Service: Orthopedics;  Laterality: Right;   right lower leg tendons      Social History   Tobacco Use   Smoking status: Former    Current packs/day: 0.00    Average packs/day: 0.5 packs/day for 25.0 years (12.5 ttl pk-yrs)    Types: Cigarettes  Start date: 05/07/1989    Quit date: 05/07/2014    Years since quitting: 9.6   Smokeless tobacco: Never  Substance Use Topics   Alcohol use: Yes    Comment: occ   Drug use: No    Family History  Problem Relation Age of Onset   Cancer Mother    Cancer Father     No Known Allergies  Health Maintenance  Topic Date Due   COVID-19 Vaccine (1) Never done   Hepatitis C Screening  Never done   DTaP/Tdap/Td (1 - Tdap) Never done   Hepatitis B Vaccines 19-59 Average Risk (1 of 3 - 19+ 3-dose series) Never done   Pneumococcal Vaccine: 50+ Years (2 of 2 - PCV20 or PCV21) 05/13/2023   Influenza Vaccine  10/25/2023    Zoster Vaccines- Shingrix (2 of 2) 12/27/2023   Colonoscopy  03/14/2030   HIV Screening  Completed   HPV VACCINES  Aged Out   Meningococcal B Vaccine  Aged Out    Objective: BP (!) 144/87   Pulse 93   Temp 98.1 F (36.7 C) (Oral)   Wt 215 lb (97.5 kg)   SpO2 98%   BMI 34.44 kg/m   Physical Exam Constitutional:      Appearance: Normal appearance.  HENT:     Head: Normocephalic and atraumatic.      Mouth: Mucous membranes are moist.  Eyes:    Conjunctiva/sclera: Conjunctivae normal.     Pupils: Pupils are equal, round, and b/l symmetrical    Cardiovascular:     Rate and Rhythm: Normal rate and regular rhythm.     Heart sounds:   Pulmonary:     Effort: Pulmonary effort is normal.     Breath sounds:  Abdominal:     General: Non distended     Palpations:   Musculoskeletal:        General: Ambulatory with a walker Right knee with staples present.  Mild swelling but no erythema, warmth or tenderness.  No signs of infection  Skin:    General: Skin is warm and dry.     Comments: No rashes.  Right arm PICC okay with no signs of infection  Neurological:     General: grossly non focal     Mental Status: awake, alert and oriented to person, place, and time.   Psychiatric:        Mood and Affect: Mood normal.   Lab Results Lab Results  Component Value Date   WBC 6.1 12/15/2023   HGB 12.1 (L) 12/15/2023   HCT 38.3 (L) 12/15/2023   MCV 91.4 12/15/2023   PLT 187 12/15/2023    Lab Results  Component Value Date   CREATININE 0.78 12/15/2023   BUN 15 12/15/2023   NA 136 12/15/2023   K 3.6 12/15/2023   CL 99 12/15/2023   CO2 24 12/15/2023    Lab Results  Component Value Date   ALT 18 12/12/2023   AST 24 12/12/2023   ALKPHOS 96 12/12/2023   BILITOT 0.4 12/12/2023    No results found for: CHOL, HDL, LDLCALC, LDLDIRECT, TRIG, CHOLHDL No results found for: LABRPR, RPRTITER No results found for: HIV1RNAQUANT, HIV1RNAVL,  CD4TABS   Assessment/Plan 48 year old male with prior history of BPD/Depression, chronic knee pain with history of gel injection, most recently streoid injection with     # Right knee septic arthritis, concerns for osteomyelitis  - Ortho office cx pending, no organisms in gram stain per Dr Barton  - 9/17 right knee  I&D with extensive synovectomy and synovial biopsies. 30 cc of frankly purulent fluid was aspirated from within the knee joint and sent for routine analysis and cultures. copious frank purulence. OR cx MSSA in 3 samples   Plan  - Continue IV cefazolin  through 10/29 - fu in approx 3 weeks close to EOT - fu with orthopedics as instructed   # Medication monitoring  - 9/21 CBC and BMP unremarkable, seen in care everywhere.  Reviewed and discussed with the patient.  # PICC - ok, no concerns   I spent 30 minutes involved in face-to-face and non-face-to-face activities for this patient on the day of the visit. Professional time spent includes the following activities: Preparing to see the patient (review of tests), Obtaining and reviewing separately obtained history (discharge record 9/22, Ortho note 9/30), Performing a medically appropriate examination and evaluation, Ordering medications/labs, Documenting clinical information in the EMR, Independently interpreting results (not separately reported), Communicating results to the patient/family member, Counseling and educating the patient/family member and Care coordination (not separately reported).   Of note, portions of this note may have been created with voice recognition software. While this note has been edited for accuracy, occasional wrong-word or 'sound-a-like' substitutions may have occurred due to the inherent limitations of voice recognition software.   Annalee Joseph, MD [facility] 12/27/2023, 10:05 AM

## 2024-01-03 ENCOUNTER — Other Ambulatory Visit (HOSPITAL_COMMUNITY): Payer: Self-pay | Admitting: Orthopaedic Surgery

## 2024-01-03 ENCOUNTER — Ambulatory Visit (HOSPITAL_COMMUNITY)
Admission: RE | Admit: 2024-01-03 | Discharge: 2024-01-03 | Disposition: A | Discharge status: Home / Self Care | Source: Ambulatory Visit | Attending: Vascular Surgery | Admitting: Vascular Surgery

## 2024-01-03 DIAGNOSIS — M79604 Pain in right leg: Secondary | ICD-10-CM | POA: Diagnosis present

## 2024-01-22 ENCOUNTER — Ambulatory Visit: Admitting: Internal Medicine

## 2024-01-22 ENCOUNTER — Encounter: Payer: Self-pay | Admitting: Internal Medicine

## 2024-01-22 ENCOUNTER — Telehealth: Payer: Self-pay

## 2024-01-22 ENCOUNTER — Other Ambulatory Visit: Payer: Self-pay

## 2024-01-22 VITALS — BP 146/90 | HR 91 | Temp 97.6°F | Ht 66.5 in | Wt 215.0 lb

## 2024-01-22 DIAGNOSIS — B9561 Methicillin susceptible Staphylococcus aureus infection as the cause of diseases classified elsewhere: Secondary | ICD-10-CM

## 2024-01-22 DIAGNOSIS — M00061 Staphylococcal arthritis, right knee: Secondary | ICD-10-CM | POA: Diagnosis not present

## 2024-01-22 MED ORDER — LINEZOLID 600 MG PO TABS: 600.0000 mg | ORAL_TABLET | Freq: Two times a day (BID) | ORAL | 0 refills | Status: AC

## 2024-01-22 NOTE — Telephone Encounter (Signed)
 Okay to pull PICC per Dr. Dennise. Orders sent to Holley Herring, RN with [facility].    Dewane Timson, BSN, RN

## 2024-01-22 NOTE — Progress Notes (Signed)
 Patient: Marc Liu  DOB: 12-04-1975 MRN: [id number] PCP: Nile Harvard, MD    Chief Complaint  Patient presents with   Follow-up     Patient Active Problem List   Diagnosis Date Noted   Medication monitoring encounter 12/27/2023   PICC (peripherally inserted central catheter) in place 12/27/2023   Essential hypertension 12/12/2023   HLD (hyperlipidemia) 12/12/2023   Migraine 12/12/2023   Septic arthritis (HCC) 12/11/2023   Systolic murmur 02/14/2015   Viral pneumonia 02/13/2015   Acute respiratory failure with hypoxia (HCC) 02/13/2015   ABRASION, FINGER, INFECTED 11/02/2008     Subjective:  Marc Liu is a 48 y.o. M with PMHx as below presents for f/u of right knee SA. Please see hpi form 12/27/23 for further details   48 year old male with prior history of BPD/Depression, chronic knee pain with history of gel injection, most recently streoid injection who is here for HFU after recent admission 9/17-9/22 for right knee septic arthritis.  Discharged on 9/22 to complete 6 weeks of IV cefazolin  through 10/29 via PICC.    10/3 Accompanied by family member.  Getting IV cefazolin  without any concerns related to the PICC or antibiotics.  Denies nausea, vomiting, rashes or diarrhea.  Walks with walker, some soreness in rt leg but no other concerns.  Has been seen by orthopedics after discharge from the hospital and half of the staples were taken out. Has a nurse visit on 10/9 to take remaining staples out and Ortho visit with doctor on 10/29. Doing well with no concern   Review of Systems  All other systems reviewed and are negative.   Past Medical History:  Diagnosis Date   Bipolar disorder (HCC)    Depression    Migraines    Shortness of breath dyspnea    Sinusitis     Outpatient Medications Prior to Visit  Medication Sig Dispense Refill   aspirin  EC 81 MG tablet Take 1 tablet (81 mg total) by mouth 2 (two) times daily. Swallow whole.  60 tablet 0   carbamazepine  (TEGRETOL ) 200 MG tablet Take 200 mg by mouth 3 (three) times daily.     ceFAZolin  (ANCEF ) IVPB Inject 2 g into the vein every 8 (eight) hours. Indication:  MSSA septic R-knee First Dose: Yes Last Day of Therapy:  01/22/24 Labs - Once weekly:  CBC/D and BMP, Labs - Once weekly: ESR and CRP Method of administration: IV Push Method of administration may be changed at the discretion of home infusion pharmacist based upon assessment of the patient and/or caregiver's ability to self-administer the medication ordered. 110 Units 0   metoprolol  succinate (TOPROL -XL) 50 MG 24 hr tablet Take 50 mg by mouth daily.     rizatriptan (MAXALT) 10 MG tablet Take 1 tablet by mouth as needed. For migraine pain  4   rosuvastatin  (CRESTOR ) 40 MG tablet Take 40 mg by mouth daily.     topiramate  (TOPAMAX ) 25 MG tablet Take 25 mg by mouth in the morning, at noon, and at bedtime.     senna-docusate (SENOKOT-S) 8.6-50 MG tablet Take 1 tablet by mouth 2 (two) times daily. (Patient not taking: Reported on 01/22/2024) 60 tablet 0   No facility-administered medications prior to visit.     No Known Allergies  Social History   Tobacco Use   Smoking status: Former    Current packs/day: 0.00    Average packs/day: 0.5 packs/day for 25.0 years (12.5 ttl pk-yrs)    Types: Cigarettes  Start date: 05/07/1989    Quit date: 05/07/2014    Years since quitting: 9.7   Smokeless tobacco: Never  Substance Use Topics   Alcohol use: Not Currently    Comment: occ   Drug use: No    Family History  Problem Relation Age of Onset   Cancer Mother    Cancer Father     Objective:   Vitals:   01/22/24 1034  BP: (!) 146/90  Pulse: 91  Temp: 97.6 F (36.4 C)  TempSrc: Oral  SpO2: 97%  Weight: 215 lb (97.5 kg)  Height: 5' 6.5 (1.689 m)   Body mass index is 34.18 kg/m.  Physical Exam Constitutional:      General: He is not in acute distress.    Appearance: He is normal weight. He is not  toxic-appearing.  HENT:     Head: Normocephalic and atraumatic.     Right Ear: External ear normal.     Left Ear: External ear normal.     Nose: No congestion or rhinorrhea.     Mouth/Throat:     Mouth: Mucous membranes are moist.     Pharynx: Oropharynx is clear.  Eyes:     Extraocular Movements: Extraocular movements intact.     Conjunctiva/sclera: Conjunctivae normal.     Pupils: Pupils are equal, round, and reactive to light.  Cardiovascular:     Rate and Rhythm: Normal rate and regular rhythm.     Heart sounds: No murmur heard.    No friction rub. No gallop.  Pulmonary:     Effort: Pulmonary effort is normal.     Breath sounds: Normal breath sounds.  Abdominal:     General: Abdomen is flat. Bowel sounds are normal.     Palpations: Abdomen is soft.  Musculoskeletal:     Cervical back: Normal range of motion and neck supple.  Skin:    General: Skin is warm and dry.  Neurological:     General: No focal deficit present.     Mental Status: He is oriented to person, place, and time.  Psychiatric:        Mood and Affect: Mood normal.     Lab Results: Lab Results  Component Value Date   WBC 6.1 12/15/2023   HGB 12.1 (L) 12/15/2023   HCT 38.3 (L) 12/15/2023   MCV 91.4 12/15/2023   PLT 187 12/15/2023    Lab Results  Component Value Date   CREATININE 0.78 12/15/2023   BUN 15 12/15/2023   NA 136 12/15/2023   K 3.6 12/15/2023   CL 99 12/15/2023   CO2 24 12/15/2023    Lab Results  Component Value Date   ALT 18 12/12/2023   AST 24 12/12/2023   ALKPHOS 96 12/12/2023   BILITOT 0.4 12/12/2023     Assessment & Plan:  48 year old male with prior history of BPD/Depression, chronic knee pain with history of gel injection, most recently streoid injection with     # Right knee septic arthritis, concerns for osteomyelitis  - Ortho office cx pending, no organisms in gram stain per Dr Barton  - 9/17 right knee I&D with extensive synovectomy and synovial biopsies. 30 cc  of frankly purulent fluid was aspirated from within the knee joint and sent for routine analysis and cultures. copious frank purulence. OR cx MSSA in 3 samples   -seen by ortho on 10/10 Plan  -  IV cefazolin  through 10/29 to complete 6 weeks of abx -Linezolid x 2 weeks given pt reports  some redness at knee(crp elevated), knee appears healed. I have not seen the knee prior to visit, but it overall looks stable, does not seem acutely infected.  -F/U in 2 weeks with Dr. Dea   # Medication monitoring  # PICC - pull picc after last dose of abx 10/14 labs: wbc 5, scr 0.83, esr 48, crp 29   Loney Stank, MD [facility]   01/22/24  10:44 AM    I have personally spent 62 minutes involved in face-to-face and non-face-to-face activities for this patient on the day of the visit. Professional time spent includes the following activities: Preparing to see the patient (review of tests), Obtaining and/or reviewing separately obtained history (admission/discharge record), Performing a medically appropriate examination and/or evaluation , Ordering medications/tests/procedures, referring and communicating with other health care professionals, Documenting clinical information in the EMR, Independently interpreting results (not separately reported), Communicating results to the patient/family/caregiver, Counseling and educating the patient/family/caregiver and Care coordination (not separately reported).

## 2024-01-22 NOTE — Patient Instructions (Addendum)
 Pull picc Linezolid x 2 weeks F/U in 2 weeks with Dr. Dea

## 2024-02-05 ENCOUNTER — Ambulatory Visit: Admitting: Internal Medicine

## 2024-02-05 ENCOUNTER — Encounter: Payer: Self-pay | Admitting: Internal Medicine

## 2024-02-05 ENCOUNTER — Other Ambulatory Visit: Payer: Self-pay

## 2024-02-05 VITALS — BP 130/90 | HR 87 | Temp 98.2°F | Ht 66.5 in | Wt 220.0 lb

## 2024-02-05 DIAGNOSIS — M00061 Staphylococcal arthritis, right knee: Secondary | ICD-10-CM | POA: Diagnosis not present

## 2024-02-05 NOTE — Progress Notes (Signed)
 Patient: Marc Liu  DOB: [birth year] MRN: [id number] PCP: Marc Harvard, MD   Patient Active Problem List   Diagnosis Date Noted   Medication monitoring encounter 12/27/2023   PICC (peripherally inserted central catheter) in place 12/27/2023   Essential hypertension 12/12/2023   HLD (hyperlipidemia) 12/12/2023   Migraine 12/12/2023   Septic arthritis (HCC) 12/11/2023   Systolic murmur 02/14/2015   Viral pneumonia 02/13/2015   Acute respiratory failure with hypoxia (HCC) 02/13/2015   ABRASION, FINGER, INFECTED 11/02/2008     Subjective:  Marc Liu is a 48 y.o. M with PMHx as below presents for f/u of right knee SA. Please see hpi form 12/27/23 for further details  Today: pt has linezolid til Friday. Knee feels better that 2 weeks ago.    48 year old male with prior history of BPD/Depression, chronic knee pain with history of gel injection, most recently streoid injection who is here for HFU after recent admission 9/17-9/22 for right knee septic arthritis.  Discharged on 9/22 to complete 6 weeks of IV cefazolin  through 10/29 via PICC.    10/3 Accompanied by family member.  Getting IV cefazolin  without any concerns related to the PICC or antibiotics.  Denies nausea, vomiting, rashes or diarrhea.  Walks with walker, some soreness in rt leg but no other concerns.  Has been seen by orthopedics after discharge from the hospital and half of the staples were taken out. Has a nurse visit on 10/9 to take remaining staples out and Ortho visit with doctor on 10/29. Doing well with no concern    Review of Systems  All other systems reviewed and are negative.   Past Medical History:  Diagnosis Date   Bipolar disorder (HCC)    Depression    Migraines    Shortness of breath dyspnea    Sinusitis     Outpatient Medications Prior to Visit  Medication Sig Dispense Refill   aspirin  EC 81 MG tablet Take 1 tablet (81 mg total) by mouth 2 (two) times daily.  Swallow whole. 60 tablet 0   carbamazepine  (TEGRETOL ) 200 MG tablet Take 200 mg by mouth 3 (three) times daily.     linezolid (ZYVOX) 600 MG tablet Take 1 tablet (600 mg total) by mouth 2 (two) times daily for 14 days. 28 tablet 0   metoprolol  succinate (TOPROL -XL) 50 MG 24 hr tablet Take 50 mg by mouth daily.     rizatriptan (MAXALT) 10 MG tablet Take 1 tablet by mouth as needed. For migraine pain  4   rosuvastatin  (CRESTOR ) 40 MG tablet Take 40 mg by mouth daily.     senna-docusate (SENOKOT-S) 8.6-50 MG tablet Take 1 tablet by mouth 2 (two) times daily. (Patient not taking: Reported on 01/22/2024) 60 tablet 0   topiramate  (TOPAMAX ) 25 MG tablet Take 25 mg by mouth in the morning, at noon, and at bedtime.     No facility-administered medications prior to visit.     No Known Allergies  Social History   Tobacco Use   Smoking status: Former    Current packs/day: 0.00    Average packs/day: 0.5 packs/day for 25.0 years (12.5 ttl pk-yrs)    Types: Cigarettes    Start date: 05/07/1989    Quit date: 05/07/2014    Years since quitting: 9.7   Smokeless tobacco: Never  Substance Use Topics   Alcohol use: Not Currently    Comment: occ   Drug use: No    Family History  Problem Relation Age  of Onset   Cancer Mother    Cancer Father     Objective:  There were no vitals filed for this visit. There is no height or weight on file to calculate BMI.  Physical Exam Constitutional:      General: He is not in acute distress.    Appearance: He is normal weight. He is not toxic-appearing.  HENT:     Head: Normocephalic and atraumatic.     Right Ear: External ear normal.     Left Ear: External ear normal.     Nose: No congestion or rhinorrhea.     Mouth/Throat:     Mouth: Mucous membranes are moist.     Pharynx: Oropharynx is clear.  Eyes:     Extraocular Movements: Extraocular movements intact.     Conjunctiva/sclera: Conjunctivae normal.     Pupils: Pupils are equal, round, and  reactive to light.  Cardiovascular:     Rate and Rhythm: Normal rate and regular rhythm.     Heart sounds: No murmur heard.    No friction rub. No gallop.  Pulmonary:     Effort: Pulmonary effort is normal.     Breath sounds: Normal breath sounds.  Abdominal:     General: Abdomen is flat. Bowel sounds are normal.     Palpations: Abdomen is soft.  Musculoskeletal:     Cervical back: Normal range of motion and neck supple.  Skin:    General: Skin is warm and dry.  Neurological:     General: No focal deficit present.     Mental Status: He is oriented to person, place, and time.  Psychiatric:        Mood and Affect: Mood normal.     Lab Results: Lab Results  Component Value Date   WBC 6.1 12/15/2023   HGB 12.1 (L) 12/15/2023   HCT 38.3 (L) 12/15/2023   MCV 91.4 12/15/2023   PLT 187 12/15/2023    Lab Results  Component Value Date   CREATININE 0.78 12/15/2023   BUN 15 12/15/2023   NA 136 12/15/2023   K 3.6 12/15/2023   CL 99 12/15/2023   CO2 24 12/15/2023    Lab Results  Component Value Date   ALT 18 12/12/2023   AST 24 12/12/2023   ALKPHOS 96 12/12/2023   BILITOT 0.4 12/12/2023     Assessment & Plan:  48 year old male with prior history of BPD/Depression, chronic knee pain with history of gel injection, most recently streoid injection with     # Right knee septic arthritis, concerns for osteomyelitis  - Ortho office cx , no organisms in gram stain per Dr Marc Liu  - 9/17 right knee I&D with extensive synovectomy and synovial biopsies. 30 cc of frankly purulent fluid was aspirated from within the knee joint and sent for routine analysis and cultures. copious frank purulence. OR cx MSSA in 3 samples  -  IV cefazolin  through 10/29 to complete 6 weeks of abx->added Linezolid x 2 weeks given pt reports some redness at knee(crp elevated), knee appears healed. I have not seen the knee prior to visit, but it overall looks stable, does not seem acutely infected.  -Seen by  ortho on 10/30->discussed natural recovery of surgery. Follow up Dr. Mardella fo rlong term planning as intendes to undergo rtka. F/U in 2 months Plan  -Has ortho appt 12/11. - Complete linezolid, about 2 days left. Will get esr/crp today. Knee looks healed, surrounding erythema resolved.  -F/u in 6 weeks with ID  Loney Stank, MD [facility]   02/05/24  9:50 AM  I have personally spent 41 minutes involved in face-to-face and non-face-to-face activities for this patient on the day of the visit. Professional time spent includes the following activities: Preparing to see the patient (review of tests), Obtaining and/or reviewing separately obtained history (admission/discharge record), Performing a medically appropriate examination and/or evaluation , Ordering medications/tests/procedures, referring and communicating with other health care professionals, Documenting clinical information in the EMR, Independently interpreting results (not separately reported), Communicating results to the patient/family/caregiver, Counseling and educating the patient/family/caregiver and Care coordination (not separately reported).

## 2024-02-06 LAB — C-REACTIVE PROTEIN: CRP: 8.7 mg/L — ABNORMAL HIGH (ref <8.0)

## 2024-02-06 LAB — SEDIMENTATION RATE: Sed Rate: 19 mm/h — ABNORMAL HIGH (ref 0–15)

## 2024-02-13 ENCOUNTER — Ambulatory Visit: Payer: Self-pay | Admitting: Internal Medicine

## 2024-02-16 ENCOUNTER — Encounter: Payer: Self-pay | Admitting: Internal Medicine

## 2024-02-27 ENCOUNTER — Emergency Department (HOSPITAL_COMMUNITY)

## 2024-02-27 ENCOUNTER — Observation Stay (HOSPITAL_COMMUNITY)
Admission: EM | Admit: 2024-02-27 | Discharge: 2024-02-29 | Disposition: A | Source: Ambulatory Visit | Attending: Family Medicine | Admitting: Family Medicine

## 2024-02-27 ENCOUNTER — Other Ambulatory Visit: Payer: Self-pay

## 2024-02-27 ENCOUNTER — Ambulatory Visit: Admitting: Internal Medicine

## 2024-02-27 ENCOUNTER — Encounter: Payer: Self-pay | Admitting: Internal Medicine

## 2024-02-27 VITALS — BP 145/95 | HR 99 | Temp 98.9°F | Ht 66.5 in | Wt 225.0 lb

## 2024-02-27 DIAGNOSIS — L03115 Cellulitis of right lower limb: Secondary | ICD-10-CM | POA: Diagnosis present

## 2024-02-27 DIAGNOSIS — Z79899 Other long term (current) drug therapy: Secondary | ICD-10-CM

## 2024-02-27 DIAGNOSIS — M25561 Pain in right knee: Secondary | ICD-10-CM | POA: Diagnosis present

## 2024-02-27 DIAGNOSIS — E669 Obesity, unspecified: Secondary | ICD-10-CM | POA: Diagnosis not present

## 2024-02-27 DIAGNOSIS — E785 Hyperlipidemia, unspecified: Secondary | ICD-10-CM | POA: Diagnosis not present

## 2024-02-27 DIAGNOSIS — M00061 Staphylococcal arthritis, right knee: Secondary | ICD-10-CM

## 2024-02-27 DIAGNOSIS — G43909 Migraine, unspecified, not intractable, without status migrainosus: Secondary | ICD-10-CM | POA: Diagnosis not present

## 2024-02-27 DIAGNOSIS — Z6836 Body mass index (BMI) 36.0-36.9, adult: Secondary | ICD-10-CM | POA: Diagnosis not present

## 2024-02-27 DIAGNOSIS — M1711 Unilateral primary osteoarthritis, right knee: Secondary | ICD-10-CM | POA: Insufficient documentation

## 2024-02-27 DIAGNOSIS — E66812 Obesity, class 2: Secondary | ICD-10-CM | POA: Insufficient documentation

## 2024-02-27 DIAGNOSIS — Z87891 Personal history of nicotine dependence: Secondary | ICD-10-CM | POA: Diagnosis not present

## 2024-02-27 DIAGNOSIS — Z8669 Personal history of other diseases of the nervous system and sense organs: Secondary | ICD-10-CM

## 2024-02-27 DIAGNOSIS — L0390 Cellulitis, unspecified: Secondary | ICD-10-CM

## 2024-02-27 DIAGNOSIS — I160 Hypertensive urgency: Secondary | ICD-10-CM | POA: Diagnosis present

## 2024-02-27 DIAGNOSIS — M7051 Other bursitis of knee, right knee: Secondary | ICD-10-CM | POA: Insufficient documentation

## 2024-02-27 HISTORY — DX: Essential (primary) hypertension: I10

## 2024-02-27 HISTORY — DX: Hyperlipidemia, unspecified: E78.5

## 2024-02-27 LAB — CBC WITH DIFFERENTIAL/PLATELET
Abs Immature Granulocytes: 0.01 K/uL (ref 0.00–0.07)
Basophils Absolute: 0 K/uL (ref 0.0–0.1)
Basophils Relative: 1 %
Eosinophils Absolute: 0.2 K/uL (ref 0.0–0.5)
Eosinophils Relative: 3 %
HCT: 43 % (ref 39.0–52.0)
Hemoglobin: 13.7 g/dL (ref 13.0–17.0)
Immature Granulocytes: 0 %
Lymphocytes Relative: 29 %
Lymphs Abs: 1.8 K/uL (ref 0.7–4.0)
MCH: 27.9 pg (ref 26.0–34.0)
MCHC: 31.9 g/dL (ref 30.0–36.0)
MCV: 87.6 fL (ref 80.0–100.0)
Monocytes Absolute: 0.7 K/uL (ref 0.1–1.0)
Monocytes Relative: 11 %
Neutro Abs: 3.6 K/uL (ref 1.7–7.7)
Neutrophils Relative %: 56 %
Platelets: 167 K/uL (ref 150–400)
RBC: 4.91 MIL/uL (ref 4.22–5.81)
RDW: 14.4 % (ref 11.5–15.5)
WBC: 6.2 K/uL (ref 4.0–10.5)
nRBC: 0 % (ref 0.0–0.2)

## 2024-02-27 LAB — BASIC METABOLIC PANEL WITH GFR
Anion gap: 11 (ref 5–15)
BUN: 8 mg/dL (ref 6–20)
CO2: 25 mmol/L (ref 22–32)
Calcium: 9.5 mg/dL (ref 8.9–10.3)
Chloride: 105 mmol/L (ref 98–111)
Creatinine, Ser: 0.84 mg/dL (ref 0.61–1.24)
GFR, Estimated: >60 mL/min (ref >60)
Glucose, Bld: 85 mg/dL (ref 70–99)
Potassium: 3.9 mmol/L (ref 3.5–5.1)
Sodium: 140 mmol/L (ref 135–145)

## 2024-02-27 LAB — I-STAT CHEM 8, ED
BUN: 9 mg/dL (ref 6–20)
Calcium, Ion: 1.13 mmol/L — ABNORMAL LOW (ref 1.15–1.40)
Chloride: 104 mmol/L (ref 98–111)
Creatinine, Ser: 0.9 mg/dL (ref 0.61–1.24)
Glucose, Bld: 89 mg/dL (ref 70–99)
HCT: 40 % (ref 39.0–52.0)
Hemoglobin: 13.6 g/dL (ref 13.0–17.0)
Potassium: 5.8 mmol/L — ABNORMAL HIGH (ref 3.5–5.1)
Sodium: 140 mmol/L (ref 135–145)
TCO2: 26 mmol/L (ref 22–32)

## 2024-02-27 LAB — C-REACTIVE PROTEIN: CRP: 1.8 mg/dL — ABNORMAL HIGH (ref <1.0)

## 2024-02-27 LAB — SEDIMENTATION RATE: Sed Rate: 21 mm/h — ABNORMAL HIGH (ref 0–16)

## 2024-02-27 MED ORDER — GADOBUTROL 1 MMOL/ML IV SOLN
10.0000 mL | Freq: Once | INTRAVENOUS | Status: AC | PRN
  Administered 2024-02-27: 10 mL via INTRAVENOUS

## 2024-02-27 NOTE — ED Notes (Signed)
 Pt given paperwork to transfer to [facility] ED for evaluation, daughter to drive pt to [facility] ED. Pt advised to go straight to ED.

## 2024-02-27 NOTE — ED Notes (Signed)
 Pt arrives POV from [facility] for MRI, MRI called to let them know pt is here

## 2024-02-27 NOTE — ED Provider Notes (Signed)
 [place] EMERGENCY DEPARTMENT AT [facility] Provider Note   CSN: [id number] Arrival date & time: 02/27/24  1415     Patient presents with: Knee Pain   Marc Liu is a 48 y.o. male.   Patient is a 48 year old male who presents with redness and swelling to his right knee.  He has a history of prior recent septic arthritis.  He was admitted in September for this.  He underwent a prolonged course of antibiotics.  He said that 2 days ago he started having a little tenderness to the knee.  He has had a little knot there since the surgery and reportedly was a retained suture.  He said it got more painful around the knot and he noticed it was red and swollen today.  He followed up with his infectious disease doctor today and was sent here to have labs and an MRI of the knee.  He denies any fevers.  He said most of the pain is localized to the area around the redness and not rather than diffusely in his knee.  From ID notes: # Native right knee septic arthritis, concerns for osteomyelitis  treated with cefazolin  x 6 weeks-> linezolid  x 2 weeks - Ortho office cx , no organisms in gram stain per Dr Barton  - 9/17 right knee I&D with extensive synovectomy and synovial biopsies. 30 cc of frankly purulent fluid was aspirated from within the knee joint and sent for routine analysis and cultures. copious frank purulence. OR cx MSSA in 3 samples  -  IV cefazolin  through 10/29 to complete 6 weeks of abx->added Linezolid  x 2 weeks given pt reported some redness at knee(crp elevated), knee appears healed. I had not seen the knee prior to visit, but it overall looks stable, does not seem acutely infected. As such abx stopped on 11/14(cefazolin  x 6 weeks + linzoid x 2 weeks)        Prior to Admission medications   Medication Sig Start Date End Date Taking? Authorizing Provider  aspirin  EC 81 MG tablet Take 1 tablet (81 mg total) by mouth 2 (two) times daily. Swallow  whole. Patient not taking: Reported on 02/27/2024 12/16/23   Krishnan, Gokul, MD  carbamazepine  (TEGRETOL ) 200 MG tablet Take 200 mg by mouth 3 (three) times daily. 05/20/14 02/27/24  [provider]  metoprolol  succinate (TOPROL -XL) 50 MG 24 hr tablet Take 50 mg by mouth daily. 03/01/23   [provider]  rizatriptan (MAXALT) 10 MG tablet Take 1 tablet by mouth as needed. For migraine pain 05/20/14   [provider]  rosuvastatin  (CRESTOR ) 40 MG tablet Take 40 mg by mouth daily.    [provider]  senna-docusate (SENOKOT-S) 8.6-50 MG tablet Take 1 tablet by mouth 2 (two) times daily. Patient not taking: Reported on 02/27/2024 12/16/23   Krishnan, Gokul, MD  topiramate  (TOPAMAX ) 25 MG tablet Take 25 mg by mouth in the morning, at noon, and at bedtime.    [provider]    Allergies: Patient has no known allergies.    Review of Systems  Constitutional:  Negative for fever.  Gastrointestinal:  Negative for nausea and vomiting.  Musculoskeletal:  Positive for arthralgias and joint swelling. Negative for back pain and neck pain.  Skin:  Negative for wound.  Neurological:  Negative for weakness, numbness and headaches.    Updated Vital Signs BP (!) 148/110   Pulse 94   Temp 98.8 F (37.1 C)   Resp 18  SpO2 99%   Physical Exam Constitutional:      Appearance: He is well-developed.  HENT:     Head: Normocephalic and atraumatic.  Neck:     Comments: No pain to neck or back Musculoskeletal:        General: Tenderness present.     Cervical back: Normal range of motion and neck supple.     Comments: Patient has a small pustule to the anterior part of his knee at the area of a healed surgical incision.  There is no drainage.  There are some surrounding warmth and erythema extending across the dorsum of the knee and a little bit proximal and distal to the knee.  I do not appreciate any significant effusion.  Appears to be maybe a small effusion  present.  He does not seem to have any significant pain in the joint.  It seems to be mostly localized to the anterior knee.  Neurological:     Mental Status: He is alert and oriented to person, place, and time.     (all labs ordered are listed, but only abnormal results are displayed) Labs Reviewed  I-STAT CHEM 8, ED - Abnormal; Notable for the following components:      Result Value   Potassium 5.8 (*)    Calcium , Ion 1.13 (*)    All other components within normal limits  CULTURE, BLOOD (ROUTINE X 2)  CULTURE, BLOOD (ROUTINE X 2)  BASIC METABOLIC PANEL WITH GFR  CBC WITH DIFFERENTIAL/PLATELET  SEDIMENTATION RATE  C-REACTIVE PROTEIN    EKG: None  Radiology: No results found.   Procedures   Medications Ordered in the ED - No data to display                                  Medical Decision Making Amount and/or Complexity of Data Reviewed Labs: ordered. Radiology: ordered.   This patient presents to the ED for concern of knee pain, this involves an extensive number of treatment options, and is a complaint that carries with it a high risk of complications and morbidity.  I considered the following differential and admission for this acute, potentially life threatening condition.  The differential diagnosis includes septic arthritis, cellulitis, abscess  MDM:    Patient is a 48 year old who presents with redness and swelling to his anterior right knee.  It looks to be more suspicious for localized cellulitis rather than septic joint.  His WBC count is normal.  He is not febrile.  Discussed with his orthopedist, Dr Barton who recommends an MRI with and without contrast.  This has been ordered.  However, our MRI stops doing them here at 7 PM.  I did contact our MRI department and they were not sure if they will be able to get to it today or not as they had 3 ahead of them.  The orthopedist actually said it may be better if he was over at [facility], ED to have the MRI  because then his PA can evaluate the patient.  Discussed with Dr. Neysa who has accepted patient as a ED to ED transfer.  IV was left in place.  Patient was driven there by his family member at bedside.  (Labs, imaging, consults)  Labs: I Ordered, and personally interpreted labs.  The pertinent results include: Normal WBC count, potassium is mildly elevated on i-STAT but normal on the BMP.  Blood cultures drawn.  Imaging  Studies ordered: I ordered imaging studies including MRI knee Pending Additional history obtained from family member at bedside.  External records from outside source obtained and reviewed including history  Cardiac Monitoring: The patient was not maintained on a cardiac monitor.  If on the cardiac monitor, I personally viewed and interpreted the cardiac monitored which showed an underlying rhythm of:    Reevaluation: After the interventions noted above, I reevaluated the patient and found that they have :stayed the same  Social Determinants of Health:    Disposition: Pending  Co morbidities that complicate the patient evaluation  Past Medical History:  Diagnosis Date   Bipolar disorder (HCC)    Depression    Migraines    Shortness of breath dyspnea    Sinusitis      Medicines No orders of the defined types were placed in this encounter.   I have reviewed the patients home medicines and have made adjustments as needed  Problem List / ED Course: Problem List Items Addressed This Visit   None Visit Diagnoses       Acute pain of right knee    -  Primary                Final diagnoses:  Acute pain of right knee    ED Discharge Orders     None          Lenor Hollering, MD 02/27/24 1751

## 2024-02-27 NOTE — ED Triage Notes (Signed)
 Pt c/o R knee swelling, warmth, and redness. Pt states he had septic arthritis in September that required surgical intervention. Approximately two days ago pt began noticing increasing pain. Was evaluated by ID today and referred to ED for needle aspiration.

## 2024-02-27 NOTE — Progress Notes (Addendum)
 d     Patient: Marc Liu  DOB: Sep 20, 1975 MRN: [id number] PCP: Nile Harvard, MD    Chief Complaint  Patient presents with   Follow-up     Patient Active Problem List   Diagnosis Date Noted   Medication monitoring encounter 12/27/2023   PICC (peripherally inserted central catheter) in place 12/27/2023   Essential hypertension 12/12/2023   HLD (hyperlipidemia) 12/12/2023   Migraine 12/12/2023   Septic arthritis (HCC) 12/11/2023   Systolic murmur 02/14/2015   Viral pneumonia 02/13/2015   Acute respiratory failure with hypoxia (HCC) 02/13/2015   ABRASION, FINGER, INFECTED 11/02/2008     Subjective:  Marc Liu is a 48 y.o. M with PMHx as below presents for f/u of right knee SA.    HPI 48 year old male with prior history of BPD/Depression, chronic knee pain with history of gel injection, most recently streoid injection who is here for HFU after recent admission 9/17-9/22 for right knee septic arthritis.  Discharged on 9/22 to complete 6 weeks of IV cefazolin  through 10/29 via PICC.    10/3 Accompanied by family member.  Getting IV cefazolin  without any concerns related to the PICC or antibiotics.  Denies nausea, vomiting, rashes or diarrhea.  Walks with walker, some soreness in rt leg but no other concerns.  Has been seen by orthopedics after discharge from the hospital and half of the staples were taken out. Has a nurse visit on 10/9 to take remaining staples out and Ortho visit with doctor on 10/29. Doing well with no concern 01/22/24: Pt states knee is still a bit erythematous 11/12: : pt has linezolid  till Friday. Knee feels better than 2 weeks ago.  Today: Pt notes he went pt Monday, knee was sore but stayed sore. HE looked at knee this AM and he had pustule. Pt's wife called ortho and counled to go to ED. Review of Systems  All other systems reviewed and are negative.   Past Medical History:  Diagnosis Date   Bipolar disorder (HCC)     Depression    Migraines    Shortness of breath dyspnea    Sinusitis     Outpatient Medications Prior to Visit  Medication Sig Dispense Refill   carbamazepine  (TEGRETOL ) 200 MG tablet Take 200 mg by mouth 3 (three) times daily.     metoprolol  succinate (TOPROL -XL) 50 MG 24 hr tablet Take 50 mg by mouth daily.     rizatriptan (MAXALT) 10 MG tablet Take 1 tablet by mouth as needed. For migraine pain  4   rosuvastatin  (CRESTOR ) 40 MG tablet Take 40 mg by mouth daily.     topiramate  (TOPAMAX ) 25 MG tablet Take 25 mg by mouth in the morning, at noon, and at bedtime.     aspirin  EC 81 MG tablet Take 1 tablet (81 mg total) by mouth 2 (two) times daily. Swallow whole. (Patient not taking: Reported on 02/27/2024) 60 tablet 0   senna-docusate (SENOKOT-S) 8.6-50 MG tablet Take 1 tablet by mouth 2 (two) times daily. (Patient not taking: Reported on 02/27/2024) 60 tablet 0   No facility-administered medications prior to visit.     No Known Allergies  Social History   Tobacco Use   Smoking status: Former    Current packs/day: 0.00    Average packs/day: 0.5 packs/day for 25.0 years (12.5 ttl pk-yrs)    Types: Cigarettes    Start date: 05/07/1989    Quit date: 05/07/2014    Years since quitting: 9.8   Smokeless tobacco:  Never  Substance Use Topics   Alcohol use: Not Currently    Comment: occ   Drug use: No    Family History  Problem Relation Age of Onset   Cancer Mother    Cancer Father     Objective:   Vitals:   02/27/24 1335  BP: (!) 145/95  Pulse: 99  Temp: 98.9 F (37.2 C)  TempSrc: Oral  SpO2: 98%  Weight: 225 lb (102.1 kg)  Height: 5' 6.5 (1.689 m)   Body mass index is 35.77 kg/m.  Physical Exam Constitutional:      General: He is not in acute distress.    Appearance: He is normal weight. He is not toxic-appearing.  HENT:     Head: Normocephalic and atraumatic.     Right Ear: External ear normal.     Left Ear: External ear normal.     Nose: No congestion or  rhinorrhea.     Mouth/Throat:     Mouth: Mucous membranes are moist.     Pharynx: Oropharynx is clear.  Eyes:     Extraocular Movements: Extraocular movements intact.     Conjunctiva/sclera: Conjunctivae normal.     Pupils: Pupils are equal, round, and reactive to light.  Cardiovascular:     Rate and Rhythm: Normal rate and regular rhythm.     Heart sounds: No murmur heard.    No friction rub. No gallop.  Pulmonary:     Effort: Pulmonary effort is normal.     Breath sounds: Normal breath sounds.  Abdominal:     General: Abdomen is flat. Bowel sounds are normal.     Palpations: Abdomen is soft.  Musculoskeletal:     Cervical back: Normal range of motion and neck supple.  Skin:    General: Skin is warm and dry.  Neurological:     General: No focal deficit present.     Mental Status: He is oriented to person, place, and time.  Psychiatric:        Mood and Affect: Mood normal.     Lab Results: Lab Results  Component Value Date   WBC 6.1 12/15/2023   HGB 12.1 (L) 12/15/2023   HCT 38.3 (L) 12/15/2023   MCV 91.4 12/15/2023   PLT 187 12/15/2023    Lab Results  Component Value Date   CREATININE 0.78 12/15/2023   BUN 15 12/15/2023   NA 136 12/15/2023   K 3.6 12/15/2023   CL 99 12/15/2023   CO2 24 12/15/2023    Lab Results  Component Value Date   ALT 18 12/12/2023   AST 24 12/12/2023   ALKPHOS 96 12/12/2023   BILITOT 0.4 12/12/2023     Assessment & Plan:  48 year old male with prior history of BPD/Depression, chronic knee pain with history of gel injection, most recently streoid injection with     # Native right knee septic arthritis, concerns for osteomyelitis  treated with cefazolin  x 6 weeks-> linezolid  x 2 weeks - Ortho office cx , no organisms in gram stain per Dr Barton  - 9/17 right knee I&D with extensive synovectomy and synovial biopsies. 30 cc of frankly purulent fluid was aspirated from within the knee joint and sent for routine analysis and cultures.  copious frank purulence. OR cx MSSA in 3 samples  -  IV cefazolin  through 10/29 to complete 6 weeks of abx->added Linezolid  x 2 weeks given pt reported some redness at knee(crp elevated), knee appears healed. I had not seen the knee prior to visit,  but it overall looks stable, does not seem acutely infected. As such abx stopped on 11/14(cefazolin  x 6 weeks + linzoid x 2 weeks) -Seen by ortho on 10/30->discussed natural recovery of surgery. Follow up Dr. Beckie for long term planning as intendes to undergo rtka. -Today 02/27/24 pt presents with new boil on RLE incision site. He states he had a felt a bump there in the past. He notes he went to PT on Monday then had some soreness which did not resolve. He noted a boil on knee today which is tender. He has surrounding erythema. No fevers or chills. He called ortho and was counseled to go to the ED per pt. Plan  -Counseled to go to ED for MRI Right knee, blood cx, cbc, cmp,esr and crp. If no intervention planned and pt discharged then dalvance prior to discharge. I called Triage at [facility] to relay plan.  -last dose linezolid  around 02/07/24 -ID f/u pending ED visit    Loney Stank, MD [facility] I personally spent a total of 47 minutes in the care of the patient today including preparing to see the patient, getting/reviewing separately obtained history, performing a medically appropriate exam/evaluation, counseling and educating, referring and communicating with other health care professionals, and documenting clinical information in the EHR.   02/27/24  1:40 PM

## 2024-02-27 NOTE — Patient Instructions (Signed)
-  Counseled to go to ED for MRI Right knee, blood cx, cbc, cmp,esr and crp. If no intervention planned and pt discharged then dalvance prior to discharge -ID f/u pending ED visit

## 2024-02-28 ENCOUNTER — Other Ambulatory Visit: Payer: Self-pay

## 2024-02-28 ENCOUNTER — Encounter (HOSPITAL_COMMUNITY): Payer: Self-pay | Admitting: Family Medicine

## 2024-02-28 DIAGNOSIS — B9561 Methicillin susceptible Staphylococcus aureus infection as the cause of diseases classified elsewhere: Secondary | ICD-10-CM | POA: Diagnosis not present

## 2024-02-28 DIAGNOSIS — M869 Osteomyelitis, unspecified: Secondary | ICD-10-CM

## 2024-02-28 DIAGNOSIS — M00061 Staphylococcal arthritis, right knee: Secondary | ICD-10-CM

## 2024-02-28 DIAGNOSIS — L03115 Cellulitis of right lower limb: Secondary | ICD-10-CM | POA: Diagnosis present

## 2024-02-28 DIAGNOSIS — Z8669 Personal history of other diseases of the nervous system and sense organs: Secondary | ICD-10-CM

## 2024-02-28 DIAGNOSIS — I160 Hypertensive urgency: Secondary | ICD-10-CM | POA: Diagnosis present

## 2024-02-28 DIAGNOSIS — E669 Obesity, unspecified: Secondary | ICD-10-CM

## 2024-02-28 DIAGNOSIS — E785 Hyperlipidemia, unspecified: Secondary | ICD-10-CM

## 2024-02-28 LAB — SYNOVIAL CELL COUNT + DIFF, W/ CRYSTALS
Crystals, Fluid: NONE SEEN
Eosinophils-Synovial: 0 % (ref 0–1)
Lymphocytes-Synovial Fld: 14 % (ref 0–20)
Monocyte-Macrophage-Synovial Fluid: 13 % — ABNORMAL LOW (ref 50–90)
Neutrophil, Synovial: 73 % — ABNORMAL HIGH (ref 0–25)
WBC, Synovial: 5400 /mm3 — ABNORMAL HIGH (ref 0–200)

## 2024-02-28 MED ORDER — ACETAMINOPHEN 325 MG PO TABS: 650.0000 mg | ORAL_TABLET | Freq: Four times a day (QID) | ORAL | Status: DC | PRN

## 2024-02-28 MED ORDER — HYDRALAZINE HCL 20 MG/ML IJ SOLN: 10.0000 mg | INTRAMUSCULAR | Status: DC | PRN

## 2024-02-28 MED ORDER — LACTATED RINGERS IV BOLUS
1000.0000 mL | Freq: Once | INTRAVENOUS | Status: AC
  Administered 2024-02-28: 1000 mL via INTRAVENOUS

## 2024-02-28 MED ORDER — ONDANSETRON HCL 4 MG PO TABS: 4.0000 mg | ORAL_TABLET | Freq: Four times a day (QID) | ORAL | Status: DC | PRN

## 2024-02-28 MED ORDER — BUPIVACAINE HCL (PF) 0.5 % IJ SOLN
10.0000 mL | Freq: Once | INTRAMUSCULAR | Status: AC
  Administered 2024-02-28: 10 mL
  Filled 2024-02-28: qty 10

## 2024-02-28 MED ORDER — ALBUTEROL SULFATE (2.5 MG/3ML) 0.083% IN NEBU: 2.5000 mg | INHALATION_SOLUTION | RESPIRATORY_TRACT | Status: DC | PRN

## 2024-02-28 MED ORDER — DAPTOMYCIN-SODIUM CHLORIDE 700-0.9 MG/100ML-% IV SOLN
700.0000 mg | Freq: Every day | INTRAVENOUS | Status: DC
  Administered 2024-02-28: 700 mg via INTRAVENOUS
  Filled 2024-02-28 (×2): qty 100

## 2024-02-28 MED ORDER — ONDANSETRON HCL 4 MG/2ML IJ SOLN: 4.0000 mg | Freq: Four times a day (QID) | INTRAMUSCULAR | Status: DC | PRN

## 2024-02-28 MED ORDER — HYDROCODONE-ACETAMINOPHEN 5-325 MG PO TABS: 1.0000 | ORAL_TABLET | Freq: Four times a day (QID) | ORAL | Status: DC | PRN

## 2024-02-28 MED ORDER — SODIUM CHLORIDE 0.9% FLUSH
3.0000 mL | Freq: Two times a day (BID) | INTRAVENOUS | Status: DC
  Administered 2024-02-28 – 2024-02-29 (×3): 3 mL via INTRAVENOUS

## 2024-02-28 MED ORDER — INSULIN ASPART 100 UNIT/ML IV SOLN: 5.0000 [IU] | Freq: Once | INTRAVENOUS | Status: DC

## 2024-02-28 MED ORDER — ENOXAPARIN SODIUM 40 MG/0.4ML IJ SOSY
40.0000 mg | PREFILLED_SYRINGE | INTRAMUSCULAR | Status: DC
  Filled 2024-02-28 (×2): qty 0.4

## 2024-02-28 MED ORDER — METOPROLOL SUCCINATE ER 50 MG PO TB24
50.0000 mg | ORAL_TABLET | Freq: Every day | ORAL | Status: DC
  Administered 2024-02-28 – 2024-02-29 (×2): 50 mg via ORAL
  Filled 2024-02-28: qty 2
  Filled 2024-02-28: qty 1

## 2024-02-28 MED ORDER — ACETAMINOPHEN 650 MG RE SUPP: 650.0000 mg | Freq: Four times a day (QID) | RECTAL | Status: DC | PRN

## 2024-02-28 MED ORDER — OMEPRAZOLE MAGNESIUM 20 MG PO TBEC: 20.0000 mg | DELAYED_RELEASE_TABLET | Freq: Every day | ORAL | Status: DC

## 2024-02-28 MED ORDER — ROSUVASTATIN CALCIUM 20 MG PO TABS
40.0000 mg | ORAL_TABLET | Freq: Every day | ORAL | Status: DC
  Administered 2024-02-28 – 2024-02-29 (×2): 40 mg via ORAL
  Filled 2024-02-28 (×2): qty 2

## 2024-02-28 MED ORDER — TOPIRAMATE 25 MG PO TABS
25.0000 mg | ORAL_TABLET | Freq: Three times a day (TID) | ORAL | Status: DC
  Administered 2024-02-28 – 2024-02-29 (×4): 25 mg via ORAL
  Filled 2024-02-28 (×4): qty 1

## 2024-02-28 MED ORDER — FUROSEMIDE 10 MG/ML IJ SOLN: 20.0000 mg | Freq: Once | INTRAMUSCULAR | Status: DC

## 2024-02-28 MED ORDER — PANTOPRAZOLE SODIUM 40 MG PO TBEC
40.0000 mg | DELAYED_RELEASE_TABLET | Freq: Every day | ORAL | Status: DC
  Administered 2024-02-28: 40 mg via ORAL
  Filled 2024-02-28: qty 1

## 2024-02-28 MED ORDER — ALBUTEROL SULFATE (2.5 MG/3ML) 0.083% IN NEBU
2.5000 mg | INHALATION_SOLUTION | Freq: Four times a day (QID) | RESPIRATORY_TRACT | Status: DC
  Filled 2024-02-28: qty 3

## 2024-02-28 MED ORDER — DEXTROSE 50 % IV SOLN: 50.0000 mL | Freq: Once | INTRAVENOUS | Status: DC

## 2024-02-28 MED ORDER — CARBAMAZEPINE 200 MG PO TABS
200.0000 mg | ORAL_TABLET | Freq: Three times a day (TID) | ORAL | Status: DC
  Administered 2024-02-28 – 2024-02-29 (×5): 200 mg via ORAL
  Filled 2024-02-28 (×6): qty 1

## 2024-02-28 NOTE — H&P (Addendum)
 History and Physical    Patient: Marc Liu FMW:[id number] DOB: 30-Apr-1975 DOA: 02/27/2024 DOS: the patient was seen and examined on 02/28/2024 PCP: Nile Harvard, MD  Patient coming from: Home  Chief Complaint:  Chief Complaint  Patient presents with   Knee Pain   HPI: Marc Liu is a 48 y.o. male with medical history significant of hypertension, hyperlipidemia, migraine headaches, bipolar disorder, and prior history of septic arthritis presents with complaints of right knee pain and swelling. He is accompanied by his wife.  He was previously admitted in September (9/17-9/22), for right knee pain and swelling, during which patient underwent incision and drainage procedure. Biopsies and cultures from that procedure were positive for methicillin-sensitive Staphylococcus aureus (MSSA). He was treated with a PICC line for six weeks followed by two weeks of oral antibiotics. He was being followed by infectious disease and has been off antibiotics for a couple of weeks.  Plans were for him to follow-up with orthopedics in the near future to undergo right knee replacement.  Two days ago, he began experiencing increased pain and swelling in the right knee. The swelling has not completely resolved since the initial surgery, but it is now [name]. He also noticed new redness and a bump on the knee, which has increased in size over the last few hours, prompting his wife to call the doctor. No fevers or chills.  He is currently taking Topamax  and Tegretol  for headaches.  He has a past medical history of bipolar disorder, but he has not had symptoms in years and is not on medication for it.  He does not smoke and drinks alcohol occasionally, but not daily.  In the emergency department patient was noted to be afebrile with blood pressures elevated up to 177/112, and all other vital signs maintained.  Labs from 12/4 significant for CRP 1.8 and sedimentation rate 21.   Blood cultures were obtained.  Patient was not started on empiric antibiotics at this time.  Case was discussed with Dr Barton of orthopedics who recommended MRI of the right knee.  Patient was transferred from [facility] to [facility] due to MRI being down.  Patient had been given 1 L of lactated ringer  IV fluids.  Review of Systems: As mentioned in the history of present illness. All other systems reviewed and are negative. Past Medical History:  Diagnosis Date   Bipolar disorder (HCC)    Depression    Migraines    Shortness of breath dyspnea    Sinusitis    Past Surgical History:  Procedure Laterality Date   IRRIGATION AND DEBRIDEMENT KNEE Right 12/11/2023   Procedure: IRRIGATION AND DEBRIDEMENT KNEE;  Surgeon: Barton Drape, MD;  Location: [facility];  Service: Orthopedics;  Laterality: Right;   right lower leg tendons     Social History:  reports that he quit smoking about 9 years ago. His smoking use included cigarettes. He started smoking about 34 years ago. He has a 12.5 pack-year smoking history. He has never used smokeless tobacco. He reports that he does not currently use alcohol. He reports that he does not use drugs.  No Known Allergies  Family History  Problem Relation Age of Onset   Cancer Mother    Cancer Father     Prior to Admission medications   Medication Sig Start Date End Date Taking? Authorizing Provider  carbamazepine  (TEGRETOL ) 200 MG tablet Take 200 mg by mouth 3 (three) times daily. 05/20/14 02/27/25 Yes [provider]  metoprolol   succinate (TOPROL -XL) 50 MG 24 hr tablet Take 50 mg by mouth daily. 03/01/23  Yes [provider]  omeprazole  (PRILOSEC  OTC) 20 MG tablet Take 20 mg by mouth daily.   Yes [provider]  rosuvastatin  (CRESTOR ) 40 MG tablet Take 40 mg by mouth daily.   Yes [provider]  topiramate  (TOPAMAX ) 25 MG tablet Take 25 mg by mouth in the morning, at noon, and at bedtime.   Yes [provider]  rizatriptan (MAXALT) 10 MG tablet Take 1 tablet by mouth as needed. For migraine pain Patient not taking: Reported on 02/28/2024 05/20/14   [provider]    Physical Exam: Vitals:   02/28/24 0215 02/28/24 0216 02/28/24 0330 02/28/24 0607  BP: (!) 129/97  (!) 137/91 (!) 130/90  Pulse: 82  89 85  Resp: 15  16 16   Temp:  98.8 F (37.1 C)  98.3 F (36.8 C)  TempSrc:  Oral  Oral  SpO2: 100%  100% 98%  Weight:      Height:       Constitutional:  middle-aged male currently in no acute distress Eyes: PERRL, lids and conjunctivae normal ENMT: Mucous membranes are moist.  Normal dentition.  Neck: normal, supple  Respiratory: clear to auscultation bilaterally, no wheezing, no crackles. Normal respiratory effort. No accessory muscle use.  Cardiovascular: Regular rate and rhythm, no murmurs / rubs / gallops.   Abdomen: no tenderness, no masses palpated.   Bowel sounds positive.  Musculoskeletal: no clubbing / cyanosis.  Swelling noted on the right knee on previously healed surgical scar.  No drainage appreciated. Skin: Erythema surrounding the right knee. Neurologic: CN 2-12 grossly intact.  Able to move all extremities Psychiatric: Normal judgment and insight. Alert and oriented x 3. Normal mood.   Data Reviewed:  EKG revealed normal sinus rhythm at 88 bpm. Reviewed labs, imaging, and pertinent records as documented  Assessment and Plan:  Right knee cellulitis Right knee pain Patient found to have CRP 2.8 with ESR 21.  Orthopedics have been consulted and due to patient's history was recommended to have MRI of the right knee was obtained.  Blood cultures were obtained.  Patient had previously had septic arthritis of the right knee with cultures positive for MSSA back in September and completed prolonged course of IV antibiotics.  Suspected right knee cellulitis question possibility of recurrence of septic arthritis. - Admit to a MedSurg bed - N.p.o. for possible  need of surgery - Follow-up blood cultures and arthrocentesis studies once able to be obtained - Follow-up for official MRI read - Holding off antibiotics at this time - Hydrocodone  as needed for pain - Orthopedics consulted for need of arthrocentesis, will follow-up for any further recommendations - Infectious disease consulted and started patient on daptomycin  following arthrocentesis  Hypertensive urgency Acute.  Blood pressures initially noted to be elevated up to 177/115. - Continue metoprolol  - Hydralazine  IV as needed for elevated blood pressures.  Reevaluate possible need of adjustment of blood pressure medications if persistently elevated  History of migraine headache - Continue Topamax  and Tegretol   Hyperlipidemia - Continue Crestor   Obesity BMI 36.32 kg/m  DVT prophylaxis: Lovenox   Advance Care Planning:   Code Status: Prior    Consults: Orthopedics, infectious disease  Family Communication: Wife updated at bedside  Severity of Illness: The appropriate patient status for this patient is INPATIENT. Inpatient status is judged to be reasonable and necessary in order to provide the required intensity of service to ensure the  patient's safety. The patient's presenting symptoms, physical exam findings, and initial radiographic and laboratory data in the context of their chronic comorbidities is felt to place them at high risk for further clinical deterioration. Furthermore, it is not anticipated that the patient will be medically stable for discharge from the hospital within 2 midnights of admission.   * I certify that at the point of admission it is my clinical judgment that the patient will require inpatient hospital care spanning beyond 2 midnights from the point of admission due to high intensity of service, high risk for further deterioration and high frequency of surveillance required.*  Author: Maximino DELENA Sharps, MD 02/28/2024 7:20 AM  For on call review  [email or web address].

## 2024-02-28 NOTE — Consult Note (Signed)
 Reason for Consult:Right knee pain Referring Physician: Maximino Sharps Time called: [id number] Time at bedside: 1055   Marc Liu is an 48 y.o. male.  HPI: Celia developed a septic knee back in September. He has been improving but began to develop some pain and redness and was directed to come to the ED for reevaluation. He notes this pain is nothing like when he had the septic joint.  Past Medical History:  Diagnosis Date   Bipolar disorder (HCC)    Depression    Hyperlipidemia    Hypertension    Migraines    Shortness of breath dyspnea    Sinusitis     Past Surgical History:  Procedure Laterality Date   IRRIGATION AND DEBRIDEMENT KNEE Right 12/11/2023   Procedure: IRRIGATION AND DEBRIDEMENT KNEE;  Surgeon: Barton Drape, MD;  Location: [facility];  Service: Orthopedics;  Laterality: Right;   right lower leg tendons      Family History  Problem Relation Age of Onset   Cancer Mother    Cancer Father     Social History:  reports that he quit smoking about 9 years ago. His smoking use included cigarettes. He started smoking about 34 years ago. He has a 12.5 pack-year smoking history. He has never used smokeless tobacco. He reports that he does not currently use alcohol. He reports that he does not use drugs.  Allergies: No Known Allergies  Medications: I have reviewed the patient's current medications.  Results for orders placed or performed during the hospital encounter of 02/27/24 (from the past 48 hours)  Culture, blood (routine x 2)     Status: None (Preliminary result)   Collection Time: 02/27/24  4:24 PM   Specimen: BLOOD  Result Value Ref Range   Specimen Description      BLOOD BLOOD RIGHT HAND Performed at [facility], [address], [place], [place] [address]    Special Requests      BOTTLES DRAWN AEROBIC AND ANAEROBIC Blood Culture adequate volume Performed at [facility], [address], [place],  [place] [address]    Culture      NO GROWTH < 24 HOURS Performed at [facility], [address] [place], [place] [address]    Report Status PENDING   Culture, blood (routine x 2)     Status: None (Preliminary result)   Collection Time: 02/27/24  4:24 PM   Specimen: BLOOD  Result Value Ref Range   Specimen Description      BLOOD BLOOD LEFT ARM Performed at [facility], [address], [place], [place] [address]    Special Requests      BOTTLES DRAWN AEROBIC AND ANAEROBIC Blood Culture results may not be optimal due to an inadequate volume of blood received in culture bottles Performed at [facility], [address], [place], [place] [address]    Culture      NO GROWTH < 24 HOURS Performed at [facility], [address] [place], [place] [address]    Report Status PENDING   Basic metabolic panel     Status: None   Collection Time: 02/27/24  4:24 PM  Result Value Ref Range   Sodium 140 135 - 145 mmol/L   Potassium 3.9 3.5 - 5.1 mmol/L   Chloride 105 98 - 111 mmol/L   CO2 25 22 - 32 mmol/L   Glucose, Bld 85 70 - 99 mg/dL    Comment: Glucose reference  range applies only to samples taken after fasting for at least 8 hours.   BUN 8 6 - 20 mg/dL   Creatinine, Ser 9.15 0.61 - 1.24 mg/dL   Calcium  9.5 8.9 - 10.3 mg/dL   GFR, Estimated >39 >39 mL/min    Comment: (NOTE) Calculated using the CKD-EPI Creatinine Equation (2021)    Anion gap 11 5 - 15    Comment: Performed at [facility], [address], [place], [place] [address]  CBC with Differential     Status: None   Collection Time: 02/27/24  4:24 PM  Result Value Ref Range   WBC 6.2 4.0 - 10.5 K/uL   RBC 4.91 4.22 - 5.81 MIL/uL   Hemoglobin 13.7 13.0 - 17.0 g/dL   HCT 56.9 60.9 - 47.9 %   MCV 87.6 80.0 - 100.0 fL   MCH 27.9 26.0 - 34.0 pg   MCHC 31.9 30.0 - 36.0 g/dL   RDW 85.5 88.4 - 84.4 %   Platelets 167 150 - 400 K/uL   nRBC 0.0 0.0 - 0.2 %    Neutrophils Relative % 56 %   Neutro Abs 3.6 1.7 - 7.7 K/uL   Lymphocytes Relative 29 %   Lymphs Abs 1.8 0.7 - 4.0 K/uL   Monocytes Relative 11 %   Monocytes Absolute 0.7 0.1 - 1.0 K/uL   Eosinophils Relative 3 %   Eosinophils Absolute 0.2 0.0 - 0.5 K/uL   Basophils Relative 1 %   Basophils Absolute 0.0 0.0 - 0.1 K/uL   Immature Granulocytes 0 %   Abs Immature Granulocytes 0.01 0.00 - 0.07 K/uL    Comment: Performed at [facility], [address] [place], [place] [address]  Sedimentation rate     Status: Abnormal   Collection Time: 02/27/24  4:24 PM  Result Value Ref Range   Sed Rate 21 (H) 0 - 16 mm/hr    Comment: Performed at [facility], [address] [place], [place] [address]  C-reactive protein     Status: Abnormal   Collection Time: 02/27/24  4:24 PM  Result Value Ref Range   CRP 1.8 (H) <1.0 mg/dL    Comment: Performed at [facility], [address] [place], [place] [address]  I-stat chem 8, ED     Status: Abnormal   Collection Time: 02/27/24  4:54 PM  Result Value Ref Range   Sodium 140 135 - 145 mmol/L   Potassium 5.8 (H) 3.5 - 5.1 mmol/L   Chloride 104 98 - 111 mmol/L   BUN 9 6 - 20 mg/dL   Creatinine, Ser 9.09 0.61 - 1.24 mg/dL   Glucose, Bld 89 70 - 99 mg/dL    Comment: Glucose reference range applies only to samples taken after fasting for at least 8 hours.   Calcium , Ion 1.13 (L) 1.15 - 1.40 mmol/L   TCO2 26 22 - 32 mmol/L   Hemoglobin 13.6 13.0 - 17.0 g/dL   HCT 59.9 60.9 - 47.9 %    No results found.  Review of Systems  Constitutional:  Negative for chills, diaphoresis and fever.  HENT:  Negative for ear discharge, ear pain, hearing loss and tinnitus.   Eyes:  Negative for photophobia and pain.  Respiratory:  Negative for cough and shortness of breath.   Cardiovascular:  Negative for chest pain.  Gastrointestinal:  Negative for abdominal pain, nausea and vomiting.  Genitourinary:  Negative for dysuria,  flank pain, frequency and urgency.  Musculoskeletal:  Positive for arthralgias (Right knee). Negative for back pain, myalgias and neck pain.  Neurological:  Negative for dizziness and headaches.  Hematological:  Does not bruise/bleed easily.  Psychiatric/Behavioral:  The patient is not nervous/anxious.    Blood pressure (!) 141/110, pulse 74, temperature 98.5 F (36.9 C), temperature source Oral, resp. rate 20, height 5' 6 (1.676 m), weight 102.1 kg, SpO2 100%. Physical Exam Constitutional:      General: He is not in acute distress.    Appearance: He is well-developed. He is not diaphoretic.  HENT:     Head: Normocephalic and atraumatic.  Eyes:     General: No scleral icterus.       Right eye: No discharge.        Left eye: No discharge.     Conjunctiva/sclera: Conjunctivae normal.  Cardiovascular:     Rate and Rhythm: Normal rate and regular rhythm.  Pulmonary:     Effort: Pulmonary effort is normal. No respiratory distress.  Musculoskeletal:     Cervical back: Normal range of motion.     Comments: RLE No traumatic wounds, ecchymosis, or rash  Ant knee erythematous w/small pustule, mod TTP  Mild knee effusion, painless knee AROM  Knee stable to varus/ valgus and anterior/posterior stress  Sens DPN, SPN, TN intact  Motor EHL, ext, flex, evers 5/5  DP 2+, PT 2+, No significant edema  Skin:    General: Skin is warm and dry.  Neurological:     Mental Status: He is alert.  Psychiatric:        Mood and Affect: Mood normal.        Behavior: Behavior normal.     Assessment/Plan: Right knee effusion -- Have tapped and will r/o septic joint. Have very low suspicion for this, think he has a prepatellar bursitis.    Ozell DOROTHA Ned, PA-C Orthopedic Surgery [phone] 02/28/2024, 11:10 AM

## 2024-02-28 NOTE — ED Notes (Signed)
 PT given a fan at this time. Breathing is even and unlabored. Support person at bedside.

## 2024-02-28 NOTE — Progress Notes (Signed)
 Pharmacy Antibiotic Note  Marc Liu is a 49 y.o. male admitted on 02/27/2024 with a possible septic knee.  Pharmacy has been consulted for Daptomycin  dosing.  Plan: Daptomycin  700mg  IV q24h CK in AM  Height: 5' 6 (167.6 cm) Weight: 102.1 kg (225 lb) IBW/kg (Calculated) : 63.8  Temp (24hrs), Avg:98.7 F (37.1 C), Min:98.1 F (36.7 C), Max:99.5 F (37.5 C)  Recent Labs  Lab 02/27/24 1624 02/27/24 1654  WBC 6.2  --   CREATININE 0.84 0.90    Estimated Creatinine Clearance: 109.9 mL/min (by C-G formula based on SCr of 0.9 mg/dL).    No Known Allergies  Antimicrobials this admission: 12/5 daptomycin  >>    Dose adjustments this admission: N/a  Microbiology results: 12/5 BCx:  12/5 Knee aspiration:    Thank you for allowing pharmacy to be a part of this patient's care.  Celestia Venetia Rush 02/28/2024 12:44 PM

## 2024-02-28 NOTE — ED Notes (Signed)
 Report given to [facility]

## 2024-02-28 NOTE — Consult Note (Signed)
 [facility]                                                                                       Patient Identification: Patient Name: Marc Liu MRN: [id number] Admit Date: 02/27/2024  2:25 PM Today's Date: 02/28/2024 Reason for consult: ? Right knee septic arthritis Requesting provider: Dr. Dennise  Principal Problem:   Cellulitis of right knee Active Problems:   Hypertension   Hyperlipidemia   Antibiotics:  None   Lines/Hardware:  Assessment # Possible abscess in prior anterior rt knee scar  - Low suspicion for septic arthritis but needs to r/o septic arthritis given recent history of MSSA right knee septic arthritis - Evaluated by orthopedics and s/p aspiration of right knee joint - MRI rt knee done  # MSSA Native right knee septic arthritis/osteomyelitis s/p I&D on 9/17 with cefazolin  for 6 weeks followed by linezolid  for 2 weeks, resolved   Recommendations  - Follow-up blood cultures - Follow-up MRI right knee as well as synovial fluid aspiration/cultures - I will start patient on daptomycin  in the interim  - Monitor CBC, CMP and CPK - Follow-up orthopedic recs - Universal/standard isolation precautions Dr Fleeta dam covering this weekend and will monitor cultures   Rest of the management as per the primary team. Please call with questions or concerns.  Thank you for the consult  __________________________________________________________________________________________________________ HPI and Hospital Course: 48 year old male with prior history of HTN, HLD, migraines, bipolar disorder, depression who was sent to ED from ID clinic Dr. Dennise due to complaint of right knee swelling, redness and tenderness for 2 days.  Denied having any fevers.  He reports he went to his physical therapy on Wednesday, usually has soreness in his right knee for a day but he still had  tenderness in his right knee on Thursday morning and when he looked at his right knee, there was a swelling/radiation color in the anterior knee.  He has some radiating pain in the right knee but still ambulatory with a cane.   He was treated for native right knee septic arthritis/osteomyelitis s/p I&D on 9/17 with cefazolin  for 6 weeks followed by linezolid  for 2 weeks thereafter  At ED febrile Labs remarkable for CRP 1.8, down from 8.7, ESR 21, mildly up from 19 Was given IVF Orthopedics have been consulted MRI has been done results are pending Seen by orthopedics, right knee aspiration was done and findings of fluid was sent for cell count and cultures  ROS: General- Denies fever, chills, loss of appetite and loss of weight HEENT - Denies headache, blurry vision, neck pain, sinus pain Chest - Denies any chest pain, SOB or cough CVS- Denies any dizziness/lightheadedness, syncopal attacks, palpitations Abdomen- Denies any nausea, vomiting, abdominal pain, hematochezia and diarrhea Neuro - Denies any weakness, numbness, tingling sensation Psych - Denies any changes in mood irritability or depressive symptoms GU- Denies any burning, dysuria, hematuria or increased frequency of urination Skin - denies any rashes/lesions MSK - Rt knee soreness  Past Medical History:  Diagnosis Date   Bipolar disorder (HCC)    Depression    Hyperlipidemia  Hypertension    Migraines    Shortness of breath dyspnea    Sinusitis    Past Surgical History:  Procedure Laterality Date   IRRIGATION AND DEBRIDEMENT KNEE Right 12/11/2023   Procedure: IRRIGATION AND DEBRIDEMENT KNEE;  Surgeon: Barton Drape, MD;  Location: [facility];  Service: Orthopedics;  Laterality: Right;   right lower leg tendons     Scheduled Meds:  albuterol   2.5 mg Nebulization Q6H   carbamazepine   200 mg Oral TID   enoxaparin  (LOVENOX ) injection  40 mg Subcutaneous Q24H   metoprolol  succinate  50 mg Oral Daily   omeprazole   20  mg Oral Daily   rosuvastatin   40 mg Oral Daily   sodium chloride  flush  3 mL Intravenous Q12H   topiramate   25 mg Oral TID   Continuous Infusions: PRN Meds:.acetaminophen  **OR** acetaminophen , HYDROcodone -acetaminophen , ondansetron  **OR** ondansetron  (ZOFRAN ) IV  No Known Allergies  Social History   Socioeconomic History   Marital status: Married    Spouse name: Not on file   Number of children: Not on file   Years of education: Not on file   Highest education level: Not on file  Occupational History   Not on file  Tobacco Use   Smoking status: Former    Current packs/day: 0.00    Average packs/day: 0.5 packs/day for 25.0 years (12.5 ttl pk-yrs)    Types: Cigarettes    Start date: 05/07/1989    Quit date: 05/07/2014    Years since quitting: 9.8   Smokeless tobacco: Never  Substance and Sexual Activity   Alcohol use: Not Currently    Comment: occ   Drug use: No   Sexual activity: Not on file  Other Topics Concern   Not on file  Social History Narrative   Not on file   Social Drivers of Health   Financial Resource Strain: Low Risk  (08/28/2023)   Received from [facility]   Overall Financial Resource Strain (CARDIA)    Difficulty of Paying Living Expenses: Not hard at all  Food Insecurity: No Food Insecurity (12/12/2023)   Hunger Vital Sign    Worried About Running Out of Food in the Last Year: Never true    Ran Out of Food in the Last Year: Never true  Transportation Needs: No Transportation Needs (12/12/2023)   PRAPARE - Administrator, Civil Service (Medical): No    Lack of Transportation (Non-Medical): No  Physical Activity: Unknown (03/27/2022)   Received from [facility]   Exercise Vital Sign    On average, how many days per week do you engage in moderate to strenuous exercise (like a brisk walk)?: Patient declined    On average, how many minutes do you engage in exercise at this level?: 30 min  Stress: Patient Declined (03/27/2022)   Received from  [facility] - Occupational Stress Questionnaire    Feeling of Stress : Patient declined  Social Connections: Socially Integrated (03/27/2022)   Received from [facility]   Social Network    How would you rate your social network (family, work, friends)?: Good participation with social networks  Intimate Partner Violence: Not At Risk (12/12/2023)   Humiliation, Afraid, Rape, and Kick questionnaire    Fear of Current or Ex-Partner: No    Emotionally Abused: No    Physically Abused: No    Sexually Abused: No   Family History  Problem Relation Age of Onset   Cancer Mother  Cancer Father     Vitals BP (!) 141/110   Pulse 74   Temp 98.5 F (36.9 C) (Oral)   Resp 20   Ht 5' 6 (1.676 m)   Wt 102.1 kg   SpO2 100%   BMI 36.32 kg/m   Physical Exam Constitutional: Adult male sitting in the bed, nontoxic-appearing    Comments: HEENT WNL  Cardiovascular:     Rate and Rhythm: Normal rate     Heart sounds: S1 and S2  Pulmonary:     Effort: Pulmonary effort is normal.     Comments: Normal lung sounds  Abdominal:     Palpations: Abdomen is soft.     Tenderness: Nondistended and nontender  Musculoskeletal:        General: No swelling or tenderness in peripheral joints  Right knee with some swelling, warmth/erythematous/tender swelling in the anterior aspect of the knee, which does not involve entire right knee.  Good range of motion of right knee  Skin:    Comments: No rashes  Neurological:     General: Awake, alert and oriented, grossly nonfocal  Psychiatric:        Mood and Affect: Mood normal.    Pertinent Microbiology Results for orders placed or performed during the hospital encounter of 02/27/24  Culture, blood (routine x 2)     Status: None (Preliminary result)   Collection Time: 02/27/24  4:24 PM   Specimen: BLOOD  Result Value Ref Range Status   Specimen Description   Final    BLOOD BLOOD RIGHT HAND Performed  at [facility], [address] [place], [place] [address]    Special Requests   Final    BOTTLES DRAWN AEROBIC AND ANAEROBIC Blood Culture adequate volume Performed at [facility], [address], [place], [place] [address]    Culture   Final    NO GROWTH < 24 HOURS Performed at [facility], [address], [place], [place] [address]    Report Status PENDING  Incomplete  Culture, blood (routine x 2)     Status: None (Preliminary result)   Collection Time: 02/27/24  4:24 PM   Specimen: BLOOD  Result Value Ref Range Status   Specimen Description   Final    BLOOD BLOOD LEFT ARM Performed at [facility], [address] [place], [place] [address]    Special Requests   Final    BOTTLES DRAWN AEROBIC AND ANAEROBIC Blood Culture results may not be optimal due to an inadequate volume of blood received in culture bottles Performed at [facility], [address], [place], [place] [address]    Culture   Final    NO GROWTH < 24 HOURS Performed at [facility], [address], [place], [place] [address]    Report Status PENDING  Incomplete   Pertinent Lab seen by me:    Latest Ref Rng & Units 02/27/2024    4:54 PM 02/27/2024    4:24 PM 12/15/2023    5:21 AM  CBC  WBC 4.0 - 10.5 K/uL  6.2  6.1   Hemoglobin 13.0 - 17.0 g/dL 86.3  86.2  87.8   Hematocrit 39.0 - 52.0 % 40.0  43.0  38.3   Platelets 150 - 400 K/uL  167  187       Latest Ref Rng & Units 02/27/2024    4:54 PM 02/27/2024    4:24 PM 12/15/2023    5:21  AM  CMP  Glucose 70 - 99 mg/dL 89  85  878   BUN 6 - 20 mg/dL 9  8  15    Creatinine 0.61 - 1.24 mg/dL 9.09  9.15  9.21   Sodium 135 - 145 mmol/L 140  140  136   Potassium 3.5 - 5.1 mmol/L 5.8  3.9  3.6   Chloride 98 - 111 mmol/L 104  105  99   CO2 22 - 32 mmol/L  25  24   Calcium  8.9 - 10.3 mg/dL  9.5  9.3      Pertinent Imagings/Other Imagings Plain films and CT images have been  personally visualized and interpreted; radiology reports have been reviewed. Decision making incorporated into the Impression / Recommendations.  No results found.  I spent 80 minutes involved in face-to-face and non-face-to-face activities for this patient on the day of the visit. Professional time spent includes the following activities: Preparing to see the patient (review of tests), Obtaining and reviewing separately obtained history (ED note, H&P, orthopedic consult note), Performing a medically appropriate examination and evaluation, Ordering medications/labs, referring and communicating with other health care professionals, Documenting clinical information in the EMR, Independently interpreting results (not separately reported), Communicating results to the patient, Counseling and educating the patient and Care coordination (not separately reported).  Electronically signed by:   Plan d/w requesting provider as well as ID pharm D  Of note, portions of this note may have been created with voice recognition software. While this note has been edited for accuracy, occasional wrong-word or 'sound-a-like' substitutions may have occurred due to the inherent limitations of voice recognition software.   Annalee Orem, MD Infectious Disease Physician [facility] Pager: [phone]

## 2024-02-28 NOTE — Procedures (Signed)
 Procedure: Right knee aspiration and injection   Indication: Right knee effusion(s)   Surgeon: Ozell Ned, PA-C   Assist: None   Anesthesia: Topical refrigerant   EBL: None   Complications: None   Findings: After risks/benefits explained patient desires to undergo procedure. Consent obtained and time out performed. The right knee was sterilely prepped and aspirated. 8ml orangish fluid obtained. 6ml 0.5% Marcaine  instilled. Pt tolerated the procedure well.       Ozell DOROTHA Ned, PA-C Orthopedic Surgery [phone]

## 2024-02-28 NOTE — TOC CM/SW Note (Signed)
 Transition of Care [facility]) - Inpatient Brief Assessment   Patient Details  Name: Marc Liu MRN: [id number] Date of Birth: 08-22-1975  [facility] CM/SW Contact:    Lauraine FORBES Saa, LCSWA Phone Number: 02/28/2024, 3:01 PM   Clinical Narrative:  3:01 PM Per chart review, patient resides at home with significant other and child(ren). Patient has a PCP and insurance. Patient does not have SNF history. Patient has HH history with [facility]. Patient has DME (RW) history with [facility]. Patient's preferred pharmacy is [facility] [phone]. No TOC needs identified at this time. TOC will continue to follow.  Transition of Care Asessment: Insurance and Status: Insurance coverage has been reviewed Patient has primary care physician: Yes Home environment has been reviewed: Private Residence Prior level of function:: N/A Prior/Current Home Services: No current home services Social Drivers of Health Review: SDOH reviewed no interventions necessary Readmission risk has been reviewed: Yes (Currently Green 10%) Transition of care needs: no transition of care needs at this time

## 2024-02-28 NOTE — Plan of Care (Signed)

## 2024-02-28 NOTE — ED Provider Notes (Signed)
 Transferred for MRI w/ and w/o to rule out recurrent knee infection 2/2 overlying skin infection.   Labs Reviewed  SEDIMENTATION RATE - Abnormal; Notable for the following components:      Result Value   Sed Rate 21 (*)    All other components within normal limits  C-REACTIVE PROTEIN - Abnormal; Notable for the following components:   CRP 1.8 (*)    All other components within normal limits  I-STAT CHEM 8, ED - Abnormal; Notable for the following components:   Potassium 5.8 (*)    Calcium , Ion 1.13 (*)    All other components within normal limits  CULTURE, BLOOD (ROUTINE X 2)  CULTURE, BLOOD (ROUTINE X 2)  BASIC METABOLIC PANEL WITH GFR  CBC WITH DIFFERENTIAL/PLATELET   Wet read: full thickness cartilage loss lateral patella w/ reactive marrow edema, no osteo, no fx/disloc, small effusion, sub q edema, no subcutaneous fluid collection/abscess, distal quad myositis/edema, perifascial edema biceps femoris, ASP  D/w Dr. Fleeta Julian with ID, recommends holding antibiotics, getting cultures (blood already done) and arthrocentesis to ensure no recurrent septic joint.   D/w Dr. Celena briefly  D/w [facility] for admission.     Lorette Mayo, MD 02/28/24 [phone]

## 2024-02-28 NOTE — ED Notes (Signed)
 Floor give rolling call for impending transport.

## 2024-02-29 DIAGNOSIS — I160 Hypertensive urgency: Secondary | ICD-10-CM | POA: Diagnosis not present

## 2024-02-29 DIAGNOSIS — E66812 Obesity, class 2: Secondary | ICD-10-CM | POA: Diagnosis not present

## 2024-02-29 DIAGNOSIS — Z8669 Personal history of other diseases of the nervous system and sense organs: Secondary | ICD-10-CM | POA: Diagnosis not present

## 2024-02-29 DIAGNOSIS — E669 Obesity, unspecified: Secondary | ICD-10-CM | POA: Diagnosis not present

## 2024-02-29 DIAGNOSIS — M1711 Unilateral primary osteoarthritis, right knee: Secondary | ICD-10-CM | POA: Diagnosis not present

## 2024-02-29 DIAGNOSIS — L03115 Cellulitis of right lower limb: Secondary | ICD-10-CM | POA: Diagnosis not present

## 2024-02-29 DIAGNOSIS — E785 Hyperlipidemia, unspecified: Secondary | ICD-10-CM | POA: Diagnosis not present

## 2024-02-29 DIAGNOSIS — M7051 Other bursitis of knee, right knee: Secondary | ICD-10-CM | POA: Insufficient documentation

## 2024-02-29 DIAGNOSIS — M7041 Prepatellar bursitis, right knee: Secondary | ICD-10-CM

## 2024-02-29 LAB — CBC WITH DIFFERENTIAL/PLATELET
Abs Immature Granulocytes: 0.02 K/uL (ref 0.00–0.07)
Basophils Absolute: 0 K/uL (ref 0.0–0.1)
Basophils Relative: 1 %
Eosinophils Absolute: 0.2 K/uL (ref 0.0–0.5)
Eosinophils Relative: 5 %
HCT: 40.1 % (ref 39.0–52.0)
Hemoglobin: 13.1 g/dL (ref 13.0–17.0)
Immature Granulocytes: 0 %
Lymphocytes Relative: 37 %
Lymphs Abs: 2 K/uL (ref 0.7–4.0)
MCH: 27.7 pg (ref 26.0–34.0)
MCHC: 32.7 g/dL (ref 30.0–36.0)
MCV: 84.8 fL (ref 80.0–100.0)
Monocytes Absolute: 0.6 K/uL (ref 0.1–1.0)
Monocytes Relative: 12 %
Neutro Abs: 2.5 K/uL (ref 1.7–7.7)
Neutrophils Relative %: 45 %
Platelets: 159 K/uL (ref 150–400)
RBC: 4.73 MIL/uL (ref 4.22–5.81)
RDW: 14.2 % (ref 11.5–15.5)
WBC: 5.4 K/uL (ref 4.0–10.5)
nRBC: 0 % (ref 0.0–0.2)

## 2024-02-29 LAB — CK: Total CK: 67 U/L (ref 49–397)

## 2024-02-29 MED ORDER — CEFADROXIL 500 MG PO CAPS: 1000.0000 mg | ORAL_CAPSULE | Freq: Two times a day (BID) | ORAL | 0 refills | Status: AC

## 2024-02-29 NOTE — Plan of Care (Signed)
  Problem: Education: Goal: Knowledge of General Education information will improve Description: Including pain rating scale, medication(s)/side effects and non-pharmacologic comfort measures 02/29/2024 1542 by Burnard Almarie BROCKS, RN Outcome: Adequate for Discharge 02/29/2024 0746 by Burnard Almarie BROCKS, RN Outcome: Progressing   Problem: Health Behavior/Discharge Planning: Goal: Ability to manage health-related needs will improve 02/29/2024 1542 by Burnard Almarie BROCKS, RN Outcome: Adequate for Discharge 02/29/2024 0746 by Burnard Almarie BROCKS, RN Outcome: Progressing   Problem: Clinical Measurements: Goal: Ability to maintain clinical measurements within normal limits will improve 02/29/2024 1542 by Burnard Almarie BROCKS, RN Outcome: Adequate for Discharge 02/29/2024 0746 by Burnard Almarie BROCKS, RN Outcome: Progressing Goal: Will remain free from infection 02/29/2024 1542 by Burnard Almarie BROCKS, RN Outcome: Adequate for Discharge 02/29/2024 0746 by Burnard Almarie BROCKS, RN Outcome: Progressing Goal: Diagnostic test results will improve 02/29/2024 1542 by Burnard Almarie BROCKS, RN Outcome: Adequate for Discharge 02/29/2024 0746 by Burnard Almarie BROCKS, RN Outcome: Progressing Goal: Respiratory complications will improve 02/29/2024 1542 by Burnard Almarie BROCKS, RN Outcome: Adequate for Discharge 02/29/2024 0746 by Burnard Almarie BROCKS, RN Outcome: Progressing Goal: Cardiovascular complication will be avoided 02/29/2024 1542 by Burnard Almarie BROCKS, RN Outcome: Adequate for Discharge 02/29/2024 0746 by Burnard Almarie BROCKS, RN Outcome: Progressing   Problem: Activity: Goal: Risk for activity intolerance will decrease 02/29/2024 1542 by Burnard Almarie BROCKS, RN Outcome: Adequate for Discharge 02/29/2024 0746 by Burnard Almarie BROCKS, RN Outcome: Progressing   Problem: Nutrition: Goal: Adequate nutrition will be maintained 02/29/2024 1542 by Burnard Almarie BROCKS, RN Outcome: Adequate for Discharge 02/29/2024 0746 by  Burnard Almarie BROCKS, RN Outcome: Progressing   Problem: Coping: Goal: Level of anxiety will decrease 02/29/2024 1542 by Burnard Almarie BROCKS, RN Outcome: Adequate for Discharge 02/29/2024 0746 by Burnard Almarie BROCKS, RN Outcome: Progressing   Problem: Elimination: Goal: Will not experience complications related to bowel motility 02/29/2024 1542 by Burnard Almarie BROCKS, RN Outcome: Adequate for Discharge 02/29/2024 0746 by Burnard Almarie BROCKS, RN Outcome: Progressing Goal: Will not experience complications related to urinary retention 02/29/2024 1542 by Burnard Almarie BROCKS, RN Outcome: Adequate for Discharge 02/29/2024 0746 by Burnard Almarie BROCKS, RN Outcome: Progressing   Problem: Pain Managment: Goal: General experience of comfort will improve and/or be controlled 02/29/2024 1542 by Burnard Almarie BROCKS, RN Outcome: Adequate for Discharge 02/29/2024 0746 by Burnard Almarie BROCKS, RN Outcome: Progressing   Problem: Safety: Goal: Ability to remain free from injury will improve 02/29/2024 1542 by Burnard Almarie BROCKS, RN Outcome: Adequate for Discharge 02/29/2024 0746 by Burnard Almarie BROCKS, RN Outcome: Progressing   Problem: Skin Integrity: Goal: Risk for impaired skin integrity will decrease 02/29/2024 1542 by Burnard Almarie BROCKS, RN Outcome: Adequate for Discharge 02/29/2024 0746 by Burnard Almarie BROCKS, RN Outcome: Progressing

## 2024-02-29 NOTE — Discharge Summary (Signed)
 Physician Discharge Summary  Marc Liu FMW:[id number] DOB: 11/14/1975 DOA: 02/27/2024  PCP: Radiontchenko, Alexei, MD  Admit date: 02/27/2024 Discharge date: 02/29/24  Admitted From: Home Disposition: Home Recommendations for Outpatient Follow-up:  Outpatient follow-up with PCP in 1 to 2 weeks ID to arrange outpatient follow-up Reassess right knee and check CBC and CMP at follow-up. Please follow up on the following pending results: None  Home Health: No need identified Equipment/Devices: No need identified  Discharge Condition: Stable CODE STATUS: Full code   Follow-up Information     Radiontchenko, Alexei, MD. Schedule an appointment as soon as possible for a visit in 1 week(s).   Specialty: Family Medicine Contact information: [address] [place] [address] [phone]                 Hospital course 48 year old M with PMH of HTN, HLD, migraine, bipolar disorder and prior right knee septic arthritis for which he underwent I&D and treated with IV antibiotics for 6 weeks followed by 2 weeks of oral antibiotics presenting with increased right knee pain, swelling and erythema for 2 days.  After completing antibiotics, plans were for him to follow-up with orthopedics in the near future to undergo right knee replacement.  In ED, afebrile.  BP 177/112.  No leukocytosis.  K5.8.  CRP 1.8.  ESR 21.  Blood cultures obtained.  Orthopedic surgery and ID consulted.    Patient underwent right knee arthrocentesis.  Synovial fluid negative for septic arthritis or gout.  Started on daptomycin  per recommendation by ID.  Right knee MRI with prepatellar subcutaneous edema and skin thickening suggesting prepatellar bursitis and cellulitis and arthritic changes within right knee, moderate sprain of proximal lateral collateral ligament, tear of the medial patellar retinaculum, strain versus possible denervation changes of distal vastus lateralis and medialis muscle and  edema overlying the distal biceps femoris muscle proximal to the attachment of fibular head suggesting tendinosis or strain.  On the day of discharge, patient remained stable.  Pain improved but still with some erythema.  Cultures remain negative.  Discharged on p.o. cefadroxil  1000 mg twice daily for 2 weeks per recommendation by ID, Dr. Fleeta Rothman who will follow up on cultures.  Blood pressure normalized.  See individual problem list below for more.   Problems addressed during this hospitalization Principal Problem:   Cellulitis of right knee Active Problems:   Hypertensive urgency   History of migraine headaches   Hyperlipidemia   Obesity (BMI 30-39.9)   Bursitis of right knee   Osteoarthritis of right knee   Class II obesity    Body mass index is 36.32 kg/m.           Consultations:   Time spent 35  minutes  Vital signs Vitals:   02/29/24 0500 02/29/24 0831 02/29/24 0912 02/29/24 1528  BP:  (!) 128/90 (!) 128/90 116/83  Pulse:  73 73 78  Temp:  (!) 97.3 F (36.3 C)  98.5 F (36.9 C)  Resp:  17  16  Height:      Weight:      SpO2: 99% 99%  96%  TempSrc:  Oral  Oral  BMI (Calculated):         Discharge exam  GENERAL: No apparent distress.  Nontoxic. HEENT: MMM.  Vision and hearing grossly intact.  NECK: Supple.  No apparent JVD.  RESP:  No IWOB.  Fair aeration bilaterally. CVS:  RRR. Heart sounds normal.  ABD/GI/GU: BS+. Abd soft, NTND.  MSK/EXT:  Moves  extremities.  Erythema over anterior aspect of right knee.  Fair range of motion, about 90 degree flexion and full extension.  Mild tenderness over medial aspect of right knee. SKIN: As above. NEURO: Awake and alert. Oriented appropriately.  No apparent focal neuro deficit. PSYCH: Calm. Normal affect.   Discharge Instructions Discharge Instructions     Discharge instructions   Complete by: As directed    It has been a pleasure taking care of you!  You were hospitalized due to right knee bursitis  and cellulitis.  The fluid drained from your knee has not shown signs of deep joint infection.  Your blood culture is negative as well.  We are discharging you on oral antibiotics for 2 weeks to complete treatment course.  Follow-up with your primary care doctor in 1 to 2 weeks or sooner if needed.  Infectious disease will arrange outpatient follow-up.  You may follow-up with orthopedic surgeon per their recommendation.   Take care,   Increase activity slowly   Complete by: As directed       Allergies as of 02/29/2024   No Known Allergies      Medication List     TAKE these medications    carbamazepine  200 MG tablet Commonly known as: TEGRETOL  Take 200 mg by mouth 3 (three) times daily.   cefadroxil  500 MG capsule Commonly known as: DURICEF Take 2 capsules (1,000 mg total) by mouth 2 (two) times daily for 14 days.   metoprolol  succinate 50 MG 24 hr tablet Commonly known as: TOPROL -XL Take 50 mg by mouth daily.   omeprazole  20 MG tablet Commonly known as: PRILOSEC  OTC Take 20 mg by mouth daily.   rizatriptan 10 MG tablet Commonly known as: MAXALT Take 1 tablet by mouth as needed. For migraine pain   rosuvastatin  40 MG tablet Commonly known as: CRESTOR  Take 40 mg by mouth daily.   topiramate  25 MG tablet Commonly known as: TOPAMAX  Take 25 mg by mouth in the morning, at noon, and at bedtime.         Procedures/Studies: Right knee arthrocentesis by orthopedic surgery.   MR KNEE RIGHT W WO CONTRAST Result Date: 02/28/2024 CLINICAL DATA:  Knee pain.  Concern for soft tissue infection. EXAM: MRI OF THE RIGHT KNEE WITHOUT AND WITH CONTRAST TECHNIQUE: Multiplanar, multisequence MR imaging of the right knee was performed both before and after administration of intravenous contrast. CONTRAST:  10mL GADAVIST  GADOBUTROL  1 MMOL/ML IV SOLN COMPARISON:  Right knee radiographs dated 12/11/2023. FINDINGS: MENISCI Medial: Intact. Lateral: Intact. LIGAMENTS Cruciates: ACL and PCL  are intact. Collaterals: Medial collateral ligament is intact. There is thickening and increased signal of the origin and proximal lateral collateral ligament of the knee, most compatible with moderate sprain. Edema overlying the distal biceps femoris muscle just proximal to the attachment on the fibular head. CARTILAGE Patellofemoral: High-grade chondral thinning of the lateral patellar facet articular cartilage with full-thickness chondral defect at the level of the equator with underlying subchondral cystic change and edema. Medial: Full-thickness chondral defect of the weight-bearing aspect of the medial femoral condyle measuring approximately 9 by 5 mm. Lateral: Mild partial-thickness cartilage loss of the lateral femorotibial compartment. JOINT: Trace joint effusion with synovitis. Normal Hoffa's fat-pad. POPLITEAL FOSSA: Popliteus tendon is intact. No Baker's cyst. EXTENSOR MECHANISM: Intact quadriceps tendon. Intact patellar tendon. Discontinuity of the medial patellar retinaculum, compatible with tear. BONES: No fracture or dislocation.  No aggressive osseous lesion. Other: There is prepatellar cutaneous thickening and subcutaneous edema, which could  reflect prepatellar bursitis. Cellulitis can not be excluded. Punctate foci of postsurgical susceptibility artifact within the prepatellar subcutaneous tissues extending along the expected incision site. No loculated fluid collection. Increased T2 signal within the distal vastus lateralis and medialis muscles could reflect edema secondary to strain versus possible denervation changes. IMPRESSION: 1. Prepatellar subcutaneous edema and skin thickening, which could relate to prepatellar bursitis. Cellulitis can not be excluded. No loculated fluid collection. 2. Moderate to advanced medial and patellofemoral compartment osteoarthritis with full-thickness chondral defects, as described above. 3. Trace joint effusion with synovitis. 4. Moderate sprain of the proximal  lateral collateral ligament. 5. Edema overlying the distal biceps femoris muscle just proximal to the attachment of the fibular head, may be secondary to tendinosis or strain. 6. Tear of the medial patellar retinaculum. 7. Strain versus possible denervation changes of the distal vastus lateralis and medialis muscles. Electronically Signed   By: Harrietta Sherry M.D.   On: 02/28/2024 14:05       The results of significant diagnostics from this hospitalization (including imaging, microbiology, ancillary and laboratory) are listed below for reference.     Microbiology: Recent Results (from the past 240 hours)  Culture, blood (routine x 2)     Status: None (Preliminary result)   Collection Time: 02/27/24  4:24 PM   Specimen: BLOOD  Result Value Ref Range Status   Specimen Description   Final    BLOOD BLOOD RIGHT HAND Performed at [facility], [address] [place], [place] [address]    Special Requests   Final    BOTTLES DRAWN AEROBIC AND ANAEROBIC Blood Culture adequate volume Performed at [facility], [address] [place], [place] [address]    Culture   Final    NO GROWTH 2 DAYS Performed at [facility], [address], [place], [place] [address]    Report Status PENDING  Incomplete  Culture, blood (routine x 2)     Status: None (Preliminary result)   Collection Time: 02/27/24  4:24 PM   Specimen: BLOOD  Result Value Ref Range Status   Specimen Description   Final    BLOOD BLOOD LEFT ARM Performed at [facility], [address] [place], [place] [address]    Special Requests   Final    BOTTLES DRAWN AEROBIC AND ANAEROBIC Blood Culture results may not be optimal due to an inadequate volume of blood received in culture bottles Performed at [facility], [address] [place], [place] [address]    Culture   Final    NO GROWTH 2 DAYS Performed at [facility], [address],  [place], [place] [address]    Report Status PENDING  Incomplete  Body fluid culture w Gram Stain     Status: None (Preliminary result)   Collection Time: 02/28/24  9:07 AM   Specimen: Synovium; Body Fluid  Result Value Ref Range Status   Specimen Description SYNOVIAL  Final   Special Requests RIGHT KNEE  Final   Gram Stain   Final    FEW WBC PRESENT,BOTH PMN AND MONONUCLEAR NO ORGANISMS SEEN    Culture   Final    NO GROWTH 1 DAY Performed at [facility], [address], [place], [place] [address]    Report Status PENDING  Incomplete     Labs:  CBC: Recent Labs  Lab 02/27/24 1624 02/27/24 1654 02/29/24 0440  WBC 6.2  --  5.4  NEUTROABS  3.6  --  2.5  HGB 13.7 13.6 13.1  HCT 43.0 40.0 40.1  MCV 87.6  --  84.8  PLT 167  --  159   BMP &GFR Recent Labs  Lab 02/27/24 1624 02/27/24 1654  NA 140 140  K 3.9 5.8*  CL 105 104  CO2 25  --   GLUCOSE 85 89  BUN 8 9  CREATININE 0.84 0.90  CALCIUM  9.5  --    Estimated Creatinine Clearance: 109.9 mL/min (by C-G formula based on SCr of 0.9 mg/dL). Liver & Pancreas: No results for input(s): AST, ALT, ALKPHOS, BILITOT, PROT, ALBUMIN in the last 168 hours. No results for input(s): LIPASE, AMYLASE in the last 168 hours. No results for input(s): AMMONIA in the last 168 hours. Diabetic: No results for input(s): HGBA1C in the last 72 hours. No results for input(s): GLUCAP in the last 168 hours. Cardiac Enzymes: Recent Labs  Lab 02/29/24 0440  CKTOTAL 67   No results for input(s): PROBNP in the last 8760 hours. Coagulation Profile: No results for input(s): INR, PROTIME in the last 168 hours. Thyroid Function Tests: No results for input(s): TSH, T4TOTAL, FREET4, T3FREE, THYROIDAB in the last 72 hours. Lipid Profile: No results for input(s): CHOL, HDL, LDLCALC, TRIG, CHOLHDL, LDLDIRECT in the last 72 hours. Anemia Panel: No results for input(s): VITAMINB12, FOLATE,  FERRITIN, TIBC, IRON, RETICCTPCT in the last 72 hours. Urine analysis:    Component Value Date/Time   COLORURINE YELLOW 02/14/2015 1830   APPEARANCEUR CLEAR 02/14/2015 1830   LABSPEC 1.026 02/14/2015 1830   PHURINE 6.0 02/14/2015 1830   GLUCOSEU NEGATIVE 02/14/2015 1830   HGBUR NEGATIVE 02/14/2015 1830   BILIRUBINUR NEGATIVE 02/14/2015 1830   KETONESUR NEGATIVE 02/14/2015 1830   PROTEINUR NEGATIVE 02/14/2015 1830   NITRITE NEGATIVE 02/14/2015 1830   LEUKOCYTESUR NEGATIVE 02/14/2015 1830   Sepsis Labs: Invalid input(s): PROCALCITONIN, LACTICIDVEN   SIGNED:  Kienna Moncada T Elohim Brune, MD  [facility] 02/29/2024, 3:31 PM

## 2024-02-29 NOTE — Plan of Care (Signed)

## 2024-03-02 LAB — BODY FLUID CULTURE W GRAM STAIN: Culture: NO GROWTH

## 2024-03-03 LAB — CULTURE, BLOOD (ROUTINE X 2)
Culture: NO GROWTH
Culture: NO GROWTH
Special Requests: ADEQUATE

## 2024-03-04 ENCOUNTER — Ambulatory Visit: Admitting: Internal Medicine

## 2024-03-04 ENCOUNTER — Other Ambulatory Visit: Payer: Self-pay

## 2024-03-04 ENCOUNTER — Encounter: Payer: Self-pay | Admitting: Internal Medicine

## 2024-03-04 VITALS — BP 134/94 | HR 83 | Temp 98.1°F | Ht 66.0 in | Wt 226.0 lb

## 2024-03-04 DIAGNOSIS — M00061 Staphylococcal arthritis, right knee: Secondary | ICD-10-CM | POA: Diagnosis not present

## 2024-03-04 NOTE — Progress Notes (Unsigned)
 Patient Active Problem List   Diagnosis Date Noted   Bursitis of right knee 02/29/2024   Osteoarthritis of right knee 02/29/2024   Class II obesity 02/29/2024   Cellulitis of right knee 02/28/2024   Hypertensive urgency 02/28/2024   History of migraine headaches 02/28/2024   Obesity (BMI 30-39.9) 02/28/2024   Medication monitoring encounter 12/27/2023   PICC (peripherally inserted central catheter) in place 12/27/2023   Hypertension 12/12/2023   Hyperlipidemia 12/12/2023   Migraine 12/12/2023   Septic arthritis (HCC) 12/11/2023   Systolic murmur 02/14/2015   Viral pneumonia 02/13/2015   Acute respiratory failure with hypoxia (HCC) 02/13/2015   ABRASION, FINGER, INFECTED 11/02/2008    Patient's Medications  New Prescriptions   No medications on file  Previous Medications   CARBAMAZEPINE  (TEGRETOL ) 200 MG TABLET    Take 200 mg by mouth 3 (three) times daily.   CEFADROXIL  (DURICEF) 500 MG CAPSULE    Take 2 capsules (1,000 mg total) by mouth 2 (two) times daily for 14 days.   METOPROLOL  SUCCINATE (TOPROL -XL) 50 MG 24 HR TABLET    Take 50 mg by mouth daily.   OMEPRAZOLE  (PRILOSEC  OTC) 20 MG TABLET    Take 20 mg by mouth daily.   RIZATRIPTAN (MAXALT) 10 MG TABLET    Take 1 tablet by mouth as needed. For migraine pain   ROSUVASTATIN  (CRESTOR ) 40 MG TABLET    Take 40 mg by mouth daily.   TOPIRAMATE  (TOPAMAX ) 25 MG TABLET    Take 25 mg by mouth in the morning, at noon, and at bedtime.  Modified Medications   No medications on file  Discontinued Medications   No medications on file    Subjective: ***  Today @DATE @ : Discussed the use of AI scribe software for clinical note transcription with the patient, who gave verbal consent to proceed.  History of Present Illness     Review of Systems: ROS  Past Medical History:  Diagnosis Date   Bipolar disorder (HCC)    Depression    Hyperlipidemia    Hypertension    Migraines    Shortness of breath dyspnea     Sinusitis     Social History   Tobacco Use   Smoking status: Former    Current packs/day: 0.00    Average packs/day: 0.5 packs/day for 25.0 years (12.5 ttl pk-yrs)    Types: Cigarettes    Start date: 05/07/1989    Quit date: 05/07/2014    Years since quitting: 9.8   Smokeless tobacco: Never  Substance Use Topics   Alcohol use: Not Currently    Comment: occ   Drug use: No    Family History  Problem Relation Age of Onset   Cancer Mother    Cancer Father     No Known Allergies  Health Maintenance  Topic Date Due   COVID-19 Vaccine (1) Never done   Hepatitis C Screening  Never done   Hepatitis B Vaccines 19-59 Average Risk (1 of 3 - 19+ 3-dose series) Never done   Pneumococcal Vaccine: 50+ Years (2 of 2 - PCV20 or PCV21) 05/13/2023   Influenza Vaccine  10/25/2023   Zoster Vaccines- Shingrix (2 of 2) 12/27/2023   Colonoscopy  03/14/2030   DTaP/Tdap/Td (3 - Td or Tdap) 02/28/2033   HIV Screening  Completed   HPV VACCINES  Aged Out   Meningococcal B Vaccine  Aged Out    Objective:  Vitals:   03/04/24 1457  BP: (!) 134/94  Pulse: 83  Temp: 98.1 F (36.7 C)  TempSrc: Oral  SpO2: 97%  Weight: 226 lb (102.5 kg)  Height: 5' 6 (1.676 m)   Body mass index is 36.48 kg/m.  Physical Exam Physical Exam   Lab Results Lab Results  Component Value Date   WBC 5.4 02/29/2024   HGB 13.1 02/29/2024   HCT 40.1 02/29/2024   MCV 84.8 02/29/2024   PLT 159 02/29/2024    Lab Results  Component Value Date   CREATININE 0.90 02/27/2024   BUN 9 02/27/2024   NA 140 02/27/2024   K 5.8 (H) 02/27/2024   CL 104 02/27/2024   CO2 25 02/27/2024    Lab Results  Component Value Date   ALT 18 12/12/2023   AST 24 12/12/2023   ALKPHOS 96 12/12/2023   BILITOT 0.4 12/12/2023    No results found for: CHOL, HDL, LDLCALC, LDLDIRECT, TRIG, CHOLHDL No results found for: LABRPR, RPRTITER No results found for: HIV1RNAQUANT, HIV1RNAVL, CD4TABS   Problem List  Items Addressed This Visit   None  Results   Assessment/Plan 48 year old male with prior history of BPD/Depression, chronic knee pain with history of gel injection, most recently streoid injection with     # Native right knee septic arthritis, concerns for osteomyelitis  treated with cefazolin  x 6 weeks-> linezolid  x 2 weeks - Ortho office cx , no organisms in gram stain per Dr Barton  - 9/17 right knee I&D with extensive synovectomy and synovial biopsies. 30 cc of frankly purulent fluid was aspirated from within the knee joint and sent for routine analysis and cultures. copious frank purulence. OR cx MSSA in 3 samples  -  IV cefazolin  through 10/29 to complete 6 weeks of abx->added Linezolid  x 2 weeks given pt reported some redness at knee(crp elevated), knee appears healed. I had not seen the knee prior to visit, but it overall looks stable, does not seem acutely infected. As such abx stopped on 11/14(cefazolin  x 6 weeks + linzoid x 2 weeks) -Seen by ortho on 10/30->discussed natural recovery of surgery. Follow up Dr. Beckie for long term planning as intendes to undergo rtka. -e12/4/25pt presented with new boil on RLE incision site. He states he had a felt a bump there in the past. He notes he went to PT on Monday then had some soreness which did not resolve. He noted a boil on knee today which is tender. He has surrounding erythema. No fevers or chills. He called ortho and was counseled to go to the ED per pt. MRI showed bursiti, negaitve cx. Dieshrge don cefadroxil  x 2 33ks -f/u in one month or    Loney Stank, MD [facility] 03/04/2024, 3:04 PM

## 2024-04-01 ENCOUNTER — Encounter: Payer: Self-pay | Admitting: Internal Medicine

## 2024-04-01 ENCOUNTER — Ambulatory Visit: Admitting: Internal Medicine

## 2024-04-01 ENCOUNTER — Other Ambulatory Visit: Payer: Self-pay

## 2024-04-01 VITALS — BP 116/84 | HR 87 | Temp 98.3°F | Ht 66.0 in | Wt 230.0 lb

## 2024-04-01 DIAGNOSIS — M00061 Staphylococcal arthritis, right knee: Secondary | ICD-10-CM

## 2024-04-01 NOTE — Progress Notes (Signed)
 "     Patient: Marc Liu  DOB: 04/15/1975 MRN: [id number] PCP: Nile Harvard, MD    Chief Complaint  Patient presents with   Follow-up     Patient Active Problem List   Diagnosis Date Noted   Bursitis of right knee 02/29/2024   Osteoarthritis of right knee 02/29/2024   Class II obesity 02/29/2024   Cellulitis of right knee 02/28/2024   Hypertensive urgency 02/28/2024   History of migraine headaches 02/28/2024   Obesity (BMI 30-39.9) 02/28/2024   Medication monitoring encounter 12/27/2023   PICC (peripherally inserted central catheter) in place 12/27/2023   Hypertension 12/12/2023   Hyperlipidemia 12/12/2023   Migraine 12/12/2023   Septic arthritis (HCC) 12/11/2023   Systolic murmur 02/14/2015   Viral pneumonia 02/13/2015   Acute respiratory failure with hypoxia (HCC) 02/13/2015   ABRASION, FINGER, INFECTED 11/02/2008     Subjective:  Marc Liu is a 49 y.o. M with PMHx as below presents for f/u of right knee SA.     HPI 49 year old male with prior history of BPD/Depression, chronic knee pain with history of gel injection, most recently streoid injection who is here for HFU after recent admission 9/17-9/22 for right knee septic arthritis.  Discharged on 9/22 to complete 6 weeks of IV cefazolin  through 10/29 via PICC.    10/3 Accompanied by family member.  Getting IV cefazolin  without any concerns related to the PICC or antibiotics.  Denies nausea, vomiting, rashes or diarrhea.  Walks with walker, some soreness in rt leg but no other concerns.  Has been seen by orthopedics after discharge from the hospital and half of the staples were taken out. Has a nurse visit on 10/9 to take remaining staples out and Ortho visit with doctor on 10/29. Doing well with no concern 01/22/24: Pt states knee is still a bit erythematous 11/12: : pt has linezolid  till Friday. Knee feels better than 2 weeks ago.  02/27/24: Pt notes he went to PT Monday, knee was  sore but stayed sore. He looked at knee this AM and he had pustule. Pt's wife called ortho and counsled to go to ED. 03/04/24: notes doing well Today: dong well no new complaints.completed cefadroxil .   Review of Systems  All other systems reviewed and are negative.   Past Medical History:  Diagnosis Date   Bipolar disorder (HCC)    Depression    Hyperlipidemia    Hypertension    Migraines    Shortness of breath dyspnea    Sinusitis     Outpatient Medications Prior to Visit  Medication Sig Dispense Refill   carbamazepine  (TEGRETOL ) 200 MG tablet Take 200 mg by mouth 3 (three) times daily.     metoprolol  succinate (TOPROL -XL) 50 MG 24 hr tablet Take 50 mg by mouth daily.     omeprazole  (PRILOSEC  OTC) 20 MG tablet Take 20 mg by mouth daily.     rosuvastatin  (CRESTOR ) 40 MG tablet Take 40 mg by mouth daily.     topiramate  (TOPAMAX ) 25 MG tablet Take 25 mg by mouth in the morning, at noon, and at bedtime.     rizatriptan (MAXALT) 10 MG tablet Take 1 tablet by mouth as needed. For migraine pain (Patient not taking: Reported on 04/01/2024)  4   No facility-administered medications prior to visit.     Allergies[1]  Social History[2]  Family History  Problem Relation Age of Onset   Cancer Mother    Cancer Father     Objective:  Vitals:   04/01/24 1558  BP: 116/84  Pulse: 87  Temp: 98.3 F (36.8 C)  TempSrc: Temporal  SpO2: 97%  Weight: 230 lb (104.3 kg)  Height: 5' 6 (1.676 m)   Body mass index is 37.12 kg/m.  Physical Exam Constitutional:      General: He is not in acute distress.    Appearance: He is normal weight. He is not toxic-appearing.  HENT:     Head: Normocephalic and atraumatic.     Right Ear: External ear normal.     Left Ear: External ear normal.     Nose: No congestion or rhinorrhea.     Mouth/Throat:     Mouth: Mucous membranes are moist.     Pharynx: Oropharynx is clear.  Eyes:     Extraocular Movements: Extraocular movements intact.      Conjunctiva/sclera: Conjunctivae normal.     Pupils: Pupils are equal, round, and reactive to light.  Cardiovascular:     Rate and Rhythm: Normal rate and regular rhythm.     Heart sounds: No murmur heard.    No friction rub. No gallop.  Pulmonary:     Effort: Pulmonary effort is normal.     Breath sounds: Normal breath sounds.  Abdominal:     General: Abdomen is flat. Bowel sounds are normal.     Palpations: Abdomen is soft.  Musculoskeletal:     Cervical back: Normal range of motion and neck supple.  Skin:    General: Skin is warm and dry.  Neurological:     General: No focal deficit present.     Mental Status: He is oriented to person, place, and time.  Psychiatric:        Mood and Affect: Mood normal.     Lab Results: Lab Results  Component Value Date   WBC 5.4 02/29/2024   HGB 13.1 02/29/2024   HCT 40.1 02/29/2024   MCV 84.8 02/29/2024   PLT 159 02/29/2024    Lab Results  Component Value Date   CREATININE 0.90 02/27/2024   BUN 9 02/27/2024   NA 140 02/27/2024   K 5.8 (H) 02/27/2024   CL 104 02/27/2024   CO2 25 02/27/2024    Lab Results  Component Value Date   ALT 18 12/12/2023   AST 24 12/12/2023   ALKPHOS 96 12/12/2023   BILITOT 0.4 12/12/2023     Assessment & Plan:   49 year old male with prior history of BPD/Depression, chronic knee pain with history of gel injection, most recently streoid injection with     # Native right knee septic arthritis, concerns for osteomyelitis  treated with cefazolin  x 6 weeks-> linezolid  x 2 weeks - Ortho office cx , no organisms in gram stain per Dr Barton  - 9/17 right knee I&D with extensive synovectomy and synovial biopsies. 30 cc of frankly purulent fluid was aspirated from within the knee joint and sent for routine analysis and cultures. copious frank purulence. OR cx MSSA in 3 samples  -  IV cefazolin  through 10/29 to complete 6 weeks of abx->added Linezolid  x 2 weeks given pt reported some redness at knee(crp  elevated), knee appears healed. I had not seen the knee prior to visit, but it overall looks stable, does not seem acutely infected. As such abx stopped on 11/14(cefazolin  x 6 weeks + linzoid x 2 weeks) -Seen by ortho on 10/30->discussed natural recovery of surgery. Follow up Dr. Beckie for long term planning as intendes to undergo rtka. -On 02/27/24 pt  presented with new boil on RLE incision site. He states he had a felt a bump there in the past. He notes he went to PT on Monday then had some soreness which did not resolve. He noted a boil on knee today which is tender. He has surrounding erythema. No fevers or chills. He called ortho and was counseled to go to the ED per pt. Pt went to ED, admitted:  MRI showed bursitis, negaitve cx from knee aspirate and blood. Discharged on cefadroxil  x 2 weeks. Notes knee feels better -today 04/01/24: knee is healed. Off of abx x 2 weeks, no concern for active infection today Plan : continue to follow with ortho(possible knee replacement pending pt is infection free for a year per pt) -F/U with ID PRN  Loney Stank, MD [facility]   04/01/2024  4:20 PM I personally spent a total of 32 minutes in the care of the patient today including preparing to see the patient, getting/reviewing separately obtained history, performing a medically appropriate exam/evaluation, counseling and educating, documenting clinical information in the EHR, and independently interpreting results.     [1] No Known Allergies [2]  Social History Tobacco Use   Smoking status: Former    Current packs/day: 0.00    Average packs/day: 0.5 packs/day for 25.0 years (12.5 ttl pk-yrs)    Types: Cigarettes    Start date: 05/07/1989    Quit date: 05/07/2014    Years since quitting: 9.9   Smokeless tobacco: Never  Substance Use Topics   Alcohol use: Not Currently    Comment: occ   Drug use: No   "
